# Patient Record
Sex: Male | Born: 1955
Health system: Southern US, Community
[De-identification: ages and names within clinical notes are randomized; demographics above are authoritative.]

## PROBLEM LIST (undated history)

## (undated) DIAGNOSIS — E7800 Pure hypercholesterolemia, unspecified: Secondary | ICD-10-CM

## (undated) DIAGNOSIS — M549 Dorsalgia, unspecified: Secondary | ICD-10-CM

## (undated) DIAGNOSIS — N400 Enlarged prostate without lower urinary tract symptoms: Secondary | ICD-10-CM

## (undated) DIAGNOSIS — I10 Essential (primary) hypertension: Secondary | ICD-10-CM

## (undated) HISTORY — PX: OTHER SURGICAL HISTORY: SHX169

## (undated) HISTORY — PX: LUNG SURGERY: SHX703

---

## 1998-07-25 ENCOUNTER — Encounter: Admission: RE | Admit: 1998-07-25 | Discharge: 1998-10-08 | Payer: Self-pay | Admitting: Anesthesiology

## 1999-10-25 ENCOUNTER — Encounter: Payer: Self-pay | Admitting: Neurosurgery

## 1999-10-25 ENCOUNTER — Ambulatory Visit (HOSPITAL_COMMUNITY): Admission: RE | Admit: 1999-10-25 | Discharge: 1999-10-25 | Payer: Self-pay | Admitting: Neurosurgery

## 1999-11-14 ENCOUNTER — Encounter: Admission: RE | Admit: 1999-11-14 | Discharge: 2000-02-12 | Payer: Self-pay | Admitting: Anesthesiology

## 2000-11-25 ENCOUNTER — Encounter: Payer: Self-pay | Admitting: *Deleted

## 2000-11-25 ENCOUNTER — Ambulatory Visit (HOSPITAL_COMMUNITY): Admission: RE | Admit: 2000-11-25 | Discharge: 2000-11-25 | Payer: Self-pay | Admitting: *Deleted

## 2001-06-15 ENCOUNTER — Inpatient Hospital Stay (HOSPITAL_COMMUNITY): Admission: EM | Admit: 2001-06-15 | Discharge: 2001-06-18 | Payer: Self-pay | Admitting: Emergency Medicine

## 2001-06-16 ENCOUNTER — Encounter: Payer: Self-pay | Admitting: Family Medicine

## 2001-08-20 ENCOUNTER — Emergency Department (HOSPITAL_COMMUNITY): Admission: EM | Admit: 2001-08-20 | Discharge: 2001-08-20 | Payer: Self-pay | Admitting: *Deleted

## 2002-08-01 ENCOUNTER — Ambulatory Visit (HOSPITAL_COMMUNITY): Admission: RE | Admit: 2002-08-01 | Discharge: 2002-08-01 | Payer: Self-pay

## 2002-11-03 ENCOUNTER — Emergency Department (HOSPITAL_COMMUNITY): Admission: EM | Admit: 2002-11-03 | Discharge: 2002-11-03 | Payer: Self-pay | Admitting: Emergency Medicine

## 2002-11-03 ENCOUNTER — Encounter: Payer: Self-pay | Admitting: Emergency Medicine

## 2002-11-06 ENCOUNTER — Emergency Department (HOSPITAL_COMMUNITY): Admission: EM | Admit: 2002-11-06 | Discharge: 2002-11-06 | Payer: Self-pay | Admitting: Emergency Medicine

## 2002-11-22 ENCOUNTER — Encounter: Payer: Self-pay | Admitting: Emergency Medicine

## 2002-11-22 ENCOUNTER — Inpatient Hospital Stay (HOSPITAL_COMMUNITY): Admission: EM | Admit: 2002-11-22 | Discharge: 2002-11-23 | Payer: Self-pay | Admitting: Emergency Medicine

## 2003-11-28 ENCOUNTER — Ambulatory Visit (HOSPITAL_COMMUNITY): Admission: RE | Admit: 2003-11-28 | Discharge: 2003-11-28 | Payer: Self-pay | Admitting: Family Medicine

## 2004-02-25 ENCOUNTER — Encounter: Admission: RE | Admit: 2004-02-25 | Discharge: 2004-02-25 | Payer: Self-pay | Admitting: Sports Medicine

## 2004-10-15 ENCOUNTER — Encounter
Admission: RE | Admit: 2004-10-15 | Discharge: 2005-01-13 | Payer: Self-pay | Admitting: Physical Medicine & Rehabilitation

## 2004-10-20 ENCOUNTER — Ambulatory Visit: Payer: Self-pay | Admitting: Physical Medicine & Rehabilitation

## 2004-12-15 ENCOUNTER — Ambulatory Visit: Payer: Self-pay | Admitting: Physical Medicine & Rehabilitation

## 2005-01-14 ENCOUNTER — Encounter
Admission: RE | Admit: 2005-01-14 | Discharge: 2005-04-14 | Payer: Self-pay | Admitting: Physical Medicine & Rehabilitation

## 2005-03-04 ENCOUNTER — Ambulatory Visit: Payer: Self-pay | Admitting: Physical Medicine & Rehabilitation

## 2005-05-19 ENCOUNTER — Ambulatory Visit (HOSPITAL_COMMUNITY): Admission: RE | Admit: 2005-05-19 | Discharge: 2005-05-19 | Payer: Self-pay | Admitting: Family Medicine

## 2005-05-22 ENCOUNTER — Encounter
Admission: RE | Admit: 2005-05-22 | Discharge: 2005-08-20 | Payer: Self-pay | Admitting: Physical Medicine & Rehabilitation

## 2005-05-22 ENCOUNTER — Ambulatory Visit: Payer: Self-pay | Admitting: Physical Medicine & Rehabilitation

## 2005-06-17 ENCOUNTER — Ambulatory Visit (HOSPITAL_COMMUNITY)
Admission: RE | Admit: 2005-06-17 | Discharge: 2005-06-17 | Payer: Self-pay | Admitting: Physical Medicine & Rehabilitation

## 2005-07-22 ENCOUNTER — Encounter
Admission: RE | Admit: 2005-07-22 | Discharge: 2005-10-20 | Payer: Self-pay | Admitting: Physical Medicine & Rehabilitation

## 2005-07-22 ENCOUNTER — Ambulatory Visit: Payer: Self-pay | Admitting: Physical Medicine & Rehabilitation

## 2005-09-23 ENCOUNTER — Ambulatory Visit: Payer: Self-pay | Admitting: Physical Medicine & Rehabilitation

## 2005-12-09 ENCOUNTER — Encounter
Admission: RE | Admit: 2005-12-09 | Discharge: 2006-03-09 | Payer: Self-pay | Admitting: Physical Medicine & Rehabilitation

## 2005-12-09 ENCOUNTER — Ambulatory Visit: Payer: Self-pay | Admitting: Physical Medicine & Rehabilitation

## 2006-02-24 ENCOUNTER — Ambulatory Visit: Payer: Self-pay | Admitting: Physical Medicine & Rehabilitation

## 2006-04-16 ENCOUNTER — Ambulatory Visit: Payer: Self-pay | Admitting: Physical Medicine & Rehabilitation

## 2006-04-16 ENCOUNTER — Encounter
Admission: RE | Admit: 2006-04-16 | Discharge: 2006-07-15 | Payer: Self-pay | Admitting: Physical Medicine & Rehabilitation

## 2006-05-03 ENCOUNTER — Ambulatory Visit (HOSPITAL_COMMUNITY): Admission: RE | Admit: 2006-05-03 | Discharge: 2006-05-03 | Payer: Self-pay | Admitting: Family Medicine

## 2006-05-19 ENCOUNTER — Ambulatory Visit: Payer: Self-pay | Admitting: Physical Medicine & Rehabilitation

## 2006-07-13 ENCOUNTER — Ambulatory Visit: Payer: Self-pay | Admitting: Physical Medicine & Rehabilitation

## 2006-08-30 ENCOUNTER — Ambulatory Visit (HOSPITAL_COMMUNITY): Admission: RE | Admit: 2006-08-30 | Discharge: 2006-08-30 | Payer: Self-pay | Admitting: Family Medicine

## 2006-09-10 ENCOUNTER — Encounter
Admission: RE | Admit: 2006-09-10 | Discharge: 2006-12-09 | Payer: Self-pay | Admitting: Physical Medicine & Rehabilitation

## 2006-09-13 ENCOUNTER — Ambulatory Visit: Payer: Self-pay | Admitting: Physical Medicine & Rehabilitation

## 2007-02-21 ENCOUNTER — Encounter
Admission: RE | Admit: 2007-02-21 | Discharge: 2007-03-22 | Payer: Self-pay | Admitting: Physical Medicine & Rehabilitation

## 2007-02-21 ENCOUNTER — Ambulatory Visit: Payer: Self-pay | Admitting: Physical Medicine & Rehabilitation

## 2007-04-19 ENCOUNTER — Encounter (INDEPENDENT_AMBULATORY_CARE_PROVIDER_SITE_OTHER): Payer: Self-pay | Admitting: General Surgery

## 2007-04-19 ENCOUNTER — Ambulatory Visit (HOSPITAL_COMMUNITY): Admission: RE | Admit: 2007-04-19 | Discharge: 2007-04-19 | Payer: Self-pay | Admitting: General Surgery

## 2007-06-22 ENCOUNTER — Encounter
Admission: RE | Admit: 2007-06-22 | Discharge: 2007-06-23 | Payer: Self-pay | Admitting: Physical Medicine & Rehabilitation

## 2007-06-22 ENCOUNTER — Ambulatory Visit: Payer: Self-pay | Admitting: Physical Medicine & Rehabilitation

## 2007-09-14 ENCOUNTER — Encounter
Admission: RE | Admit: 2007-09-14 | Discharge: 2007-09-15 | Payer: Self-pay | Admitting: Physical Medicine & Rehabilitation

## 2007-09-15 ENCOUNTER — Ambulatory Visit: Payer: Self-pay | Admitting: Physical Medicine & Rehabilitation

## 2008-01-27 ENCOUNTER — Ambulatory Visit (HOSPITAL_COMMUNITY): Admission: RE | Admit: 2008-01-27 | Discharge: 2008-01-27 | Payer: Self-pay | Admitting: Family Medicine

## 2008-03-02 ENCOUNTER — Encounter
Admission: RE | Admit: 2008-03-02 | Discharge: 2008-03-05 | Payer: Self-pay | Admitting: Physical Medicine & Rehabilitation

## 2008-03-05 ENCOUNTER — Ambulatory Visit: Payer: Self-pay | Admitting: Physical Medicine & Rehabilitation

## 2008-08-03 ENCOUNTER — Encounter
Admission: RE | Admit: 2008-08-03 | Discharge: 2008-08-29 | Payer: Self-pay | Admitting: Physical Medicine & Rehabilitation

## 2008-08-23 ENCOUNTER — Ambulatory Visit: Payer: Self-pay | Admitting: Physical Medicine & Rehabilitation

## 2009-02-20 ENCOUNTER — Encounter
Admission: RE | Admit: 2009-02-20 | Discharge: 2009-02-26 | Payer: Self-pay | Admitting: Physical Medicine & Rehabilitation

## 2009-02-21 ENCOUNTER — Ambulatory Visit: Payer: Self-pay | Admitting: Physical Medicine & Rehabilitation

## 2009-05-23 ENCOUNTER — Ambulatory Visit (HOSPITAL_COMMUNITY): Admission: RE | Admit: 2009-05-23 | Discharge: 2009-05-23 | Payer: Self-pay | Admitting: Family Medicine

## 2009-06-27 ENCOUNTER — Ambulatory Visit (HOSPITAL_COMMUNITY): Admission: RE | Admit: 2009-06-27 | Discharge: 2009-06-27 | Payer: Self-pay | Admitting: Orthopedic Surgery

## 2009-07-18 ENCOUNTER — Ambulatory Visit (HOSPITAL_COMMUNITY): Admission: RE | Admit: 2009-07-18 | Discharge: 2009-07-18 | Payer: Self-pay | Admitting: Family Medicine

## 2009-11-04 ENCOUNTER — Encounter
Admission: RE | Admit: 2009-11-04 | Discharge: 2009-11-07 | Payer: Self-pay | Admitting: Physical Medicine & Rehabilitation

## 2009-11-07 ENCOUNTER — Ambulatory Visit: Payer: Self-pay | Admitting: Physical Medicine & Rehabilitation

## 2010-04-01 ENCOUNTER — Encounter
Admission: RE | Admit: 2010-04-01 | Discharge: 2010-04-07 | Payer: Self-pay | Source: Home / Self Care | Attending: Physical Medicine & Rehabilitation | Admitting: Physical Medicine & Rehabilitation

## 2010-04-07 ENCOUNTER — Ambulatory Visit: Payer: Self-pay | Admitting: Physical Medicine & Rehabilitation

## 2010-07-23 ENCOUNTER — Encounter
Admission: RE | Admit: 2010-07-23 | Discharge: 2010-08-05 | Payer: Self-pay | Source: Home / Self Care | Attending: Physical Medicine & Rehabilitation | Admitting: Physical Medicine & Rehabilitation

## 2010-07-28 ENCOUNTER — Ambulatory Visit
Admission: RE | Admit: 2010-07-28 | Discharge: 2010-07-28 | Payer: Self-pay | Source: Home / Self Care | Attending: Physical Medicine & Rehabilitation | Admitting: Physical Medicine & Rehabilitation

## 2010-08-04 ENCOUNTER — Ambulatory Visit (HOSPITAL_COMMUNITY)
Admission: RE | Admit: 2010-08-04 | Discharge: 2010-08-04 | Payer: Self-pay | Source: Home / Self Care | Attending: Family Medicine | Admitting: Family Medicine

## 2010-11-17 ENCOUNTER — Ambulatory Visit: Payer: Medicare Other | Admitting: Physical Medicine & Rehabilitation

## 2010-11-17 ENCOUNTER — Encounter: Payer: BC Managed Care – PPO | Attending: Physical Medicine & Rehabilitation

## 2010-11-17 ENCOUNTER — Ambulatory Visit: Payer: Self-pay | Admitting: Physical Medicine & Rehabilitation

## 2010-11-17 DIAGNOSIS — M79609 Pain in unspecified limb: Secondary | ICD-10-CM | POA: Insufficient documentation

## 2010-11-17 DIAGNOSIS — M47817 Spondylosis without myelopathy or radiculopathy, lumbosacral region: Secondary | ICD-10-CM | POA: Insufficient documentation

## 2010-11-17 DIAGNOSIS — M545 Low back pain, unspecified: Secondary | ICD-10-CM | POA: Insufficient documentation

## 2010-11-17 NOTE — Procedures (Signed)
[name], Victor Fields               ACCOUNT NO.:  [id number]  [name]           PATIENT TYPE:  O  [name]                          [name]  PHYSICIAN:  [name], M.D.DATE OF BIRTH:  [birth year]  DATE OF PROCEDURE:  11/17/2010 DATE OF DISCHARGE:                              OPERATIVE REPORT  This is bilateral L5 dorsal ramus injection, bilateral L4, and bilateral L3 medial branch block under fluoroscopic guidance.  INDICATIONS:  Lumbar spondylosis, left greater than right side with lumbar pain radiating to the thighs.  The pain is only partially responsive to medication management.  Did not respond to SI injections. Pain is rated as 6/10, interfering with walking, bending, sitting. Oswestry score 66%.  Informed consent was obtained after describing risks and benefits of the procedure with the patient.  These include bleeding, bruising, and infection.  He elects to proceed and has given written consent.  The patient was placed prone on fluoroscopy table.  Betadine prep, sterile drape.  A 25-gauge 1-1/2-inch needle was used to anesthetize the skin and subcutaneous tissue, 1% lidocaine x2 mL, and a 22-gauge 3-1/2-inch spinal needle was inserted under fluoroscopic guidance, starting at the left S1 SAP sacral ala junction, bone contact made, confirmed with lateral imaging.  Omnipaque 180 x 0.5 mL demonstrated no intravascular uptake.  Then, 0.5 mL solution containing 1 mL of 4 mg/mL dexamethasone, 2 mL of 2% MPF lidocaine.  Then, the left L5 SAP transverse process junction targeted, bone contact made.  Omnipaque 180 x 0.5 mL demonstrated no intravascular uptake, then 0.5 mL of dexamethasone lidocaine solution was injected.  Then, the left L4 SAP transverse process junction targeted, bone contact made.  Omnipaque 180 x 0.5 mL demonstrated no intravascular uptake.  Then, 0.5 mL of the dexamethasone lidocaine solution was injected.  The same  procedure was repeated on the right side using same needle, injectate, and technique.  The patient tolerated the procedure well.  Pre and post injection vitals stable. Post injection instructions given.     [name], M.D. Electronically Signed    AEK/MEDQ  D:  11/17/2010 09:50:53  T:  11/17/2010 22:07:20  Job:  [id number]

## 2010-11-18 NOTE — Procedures (Signed)
NAMEASLAN, Victor NO.:  [id number]   [name]          PATIENT TYPE:  REC   [name]                          [name]   PHYSICIAN:  [name], M.D.DATE OF BIRTH:  [birth year]   DATE OF PROCEDURE:  06/23/2007  DATE OF DISCHARGE:                               OPERATIVE REPORT   Left sacroiliac injection under fluoroscopic guidance.   INDICATIONS:  Left sacroiliac pain.  He has had previous good relief  with sacroiliac injection.  Pain in his low back has been relieved after  lumbar radiofrequency procedure.  Pain in his buttocks had flared up  over the last month or so.  His pain persists despite narcotic analgesic  medications.   Informed consent was obtained after describing risks and benefits of the  procedure to the patient.  These include bleeding, bruising, infection.  He elects to proceed and has given written consent.   The patient placed prone on fluoroscopy table.  Betadine prep, sterile  drape.  A 25-gauge inch and half needle was used to anesthetize the skin  and subcu tissue with 1% lidocaine x2 mL, and a 25-gauge 3-inch spinal  needle was inserted into left SI joint under AP lateral imaging.  Omnipaque 20 x 0.5 mL demonstrated good joint outline, followed by  injection of 1 mL of 2% MPF lidocaine and 0.5 mL of 40 mg/mL of Depo-  Medrol.  The patient tolerated the procedure well.  Pre/post-injection  vitals stable.      [name], M.D.  Electronically Signed     AEK/MEDQ  D:  06/23/2007 12:38:41  T:  06/24/2007 09:48:20  Job:  [id number]   cc:   Patrica Duel, M.D.  Fax: [phone]

## 2010-11-18 NOTE — H&P (Signed)
NAMECOLLYN, Victor Fields NO.:  [id number]   [name]          PATIENT TYPE:  AMB   LOCATION:  DAY                           FACILITY:  [facility]   PHYSICIAN:  Dalia Heading, M.D.  DATE OF BIRTH:  05-05-1956   DATE OF ADMISSION:  04/19/2007  DATE OF DISCHARGE:  LH                              HISTORY & PHYSICAL   CHIEF COMPLAINT:  Need for screening colonoscopy.   HISTORY OF PRESENT ILLNESS:  The patient is a 55 year old white male who  is referred for endoscopic. Evaluation.  He needs a colonoscopy for  screening purposes.  No abdominal pain, weight loss, nausea, vomiting,  diarrhea, constipation, melena, hematochezia ever noted.  He has never  had a colonoscopy.  There is no family history of colon carcinoma.   PAST MEDICAL HISTORY:  1. Hypertension.  2. Depression.  3. Chronic pain.   PAST SURGICAL HISTORY:  Left leg, hip, knee surgeries.   CURRENT MEDICATIONS:  1. Simvastatin.  2. Xanax.  3. Blood pressure pill.  4. Muscle relaxant.  5. Lexapro.  6. OxyContin.   ALLERGIES:  No known drug allergies.   REVIEW OF SYSTEMS:  The patient smokes a pack of cigarettes a day.  Denies any alcohol use.  He denies any cardiopulmonary difficulties or  bleeding disorders.   PHYSICAL EXAMINATION:  GENERAL:  The patient is a well-developed, well-  nourished white male in no acute distress.  LUNGS:  Clear to auscultation with equal breath sounds bilaterally.  HEART:  Reveals regular rate and rhythm without S3, S4, murmurs.  ABDOMEN:  Soft, nontender, nondistended.  No hepatosplenomegaly or  masses are noted.  RECTAL:  Was deferred to the procedure.   IMPRESSION:  Need for screening colonoscopy.   PLAN:  The patient is scheduled for a colonoscopy on 04/19/07.  The  risks and benefits of the procedure including bleeding and perforation  were fully explained to the patient who gave informed consent.      Dalia Heading, M.D.  Electronically Signed     MAJ/MEDQ  D:  03/29/2007  T:  03/29/2007  Job:  [id number]   cc:   Dalia Heading, M.D.  Fax: [phone]   Patrica Duel, M.D.  Fax: [phone]

## 2010-11-18 NOTE — Procedures (Signed)
NAMEDOMENIC, SCHOENBERGER NO.:  [id number]   [name]          PATIENT TYPE:  REC   [name]                          [name]   PHYSICIAN:  [name], M.D.DATE OF BIRTH:  1956-04-23   DATE OF PROCEDURE:  09/15/2007  DATE OF DISCHARGE:                               OPERATIVE REPORT   PROCEDURE PERFORMED:  Left sacroiliac injection under fluoroscopic  guidance.   INDICATIONS:  Left sided lumbar, buttocks, and posterior thigh pain. He  has had only partially relief with medication management and has had  good relief in the past with sacroiliac injections with the duration  effect approximately three months.  Informed consent was obtained after  describing the risks and benefits of the procedure to the patient.  These include bleeding, bruising, infection, loss of bowel and bladder  function, temporary or permanent paralysis.  He elects to proceed and  has given written consent.  He denies any new medications.  He denies  any antibiotics or blood thinners.   PROCEDURE IN DETAIL:  The patient was placed prone on the fluoroscopy  table.  Betadine prep, sterile drape.  A 25 gauge 1 1/2 inch needle was  used to anesthetize the skin and subcu tissue, 1% lidocaine x2 mL.  Then, a 22 gauge 3 1/2 inch spinal needle was inserted under  fluoroscopic guidance in the left SI joint AP, lateral, and oblique  imaging.  Omnipaque 180 showed good joint arthrogram followed by  injection of 1 mL of 2% MPF lidocaine 0.5 mL of 40 mg/mL Depo-Medrol.  Pre-injection pain levels 7-9 out of 10.  Post injection pain level 2  out of 10.  Post injection instructions given.  Return in three months  for repeat injection as this has been his usual pattern.      [name], M.D.  Electronically Signed     AEK/MEDQ  D:  09/15/2007 09:05:56  T:  09/15/2007 23:50:03  Job:  [id number]

## 2010-11-18 NOTE — Procedures (Signed)
[name], Fields NO.:  [id number]   [name]          PATIENT TYPE:  REC   [name]                          [name]   PHYSICIAN:  [name], M.D.DATE OF BIRTH:  03/28/1956   DATE OF PROCEDURE:  DATE OF DISCHARGE:                               OPERATIVE REPORT   This is a left sacroiliac injection.   INDICATIONS:  Left sacroiliac disorder.  He has had a 6 months duration  of relief with the previous injection, last injection done in February  2010.  Pain is only partially responsive to medication management  including narcotic analgesics and interferes with self-care and  mobility.   Informed consent was obtained after describing risks and benefits of the  procedure with the patient.  These include bleeding, bruising,  infection, temporary, or permanent lower extremity weakness.  The  patient elects to proceed and has given written consent.  The patient  was placed prone on fluoroscopy table.  Betadine prep, sterile drape.  A  25-gauge 1-1/2-inch needle was used to anesthetize the skin and subcu  tissue with 1% lidocaine x2 mL.  Then, a 25-gauge 3-inch spinal needle  was inserted into left SI joint under AP and lateral imaging.  Omnipaque  180 under live fluoro demonstrated good spread of contrast followed by  injection of 0.5 mL of 40 mg/mL Depo-Medrol and 1 mL of 2% MPF  lidocaine.  The patient tolerated the procedure well.  Pre -and post-  injection vitals stable.  Post-injection instructions given.  He will  call me when the injections off again.   ADDENDUM:  The patient has had right knee pain.  He feels like he has  been favoring the left side based on his left SI pain.  We will see how  he does with this.  I could certainly inject his right knee if needed at  another time.      [name], M.D.  Electronically Signed     AEK/MEDQ  D:  02/21/2009 09:36:53  T:  02/21/2009 14:39:35   Job:  [id number]

## 2010-11-18 NOTE — Procedures (Signed)
Victor, Fields NO.:  [id number]   [name]          PATIENT TYPE:  REC   [name]                          [name]   PHYSICIAN:  [name], M.D.DATE OF BIRTH:  [birth year]   DATE OF PROCEDURE:  03/05/2008  DATE OF DISCHARGE:                               OPERATIVE REPORT   PROCEDURE:  This is sacroiliac injection under fluoroscopic guidance.   Informed consent was obtained after describing risks and benefits of the  procedure with the patient.  These include bleeding, bruising,  infection.  He elects to proceed and has given written consent.   INDICATIONS:  The left groin and right-sided lower back pain, which  interferes with bathing, meal prep, household duties, and ambulation.  Only partially responsive to medication management.  Previous relief  with sacroiliac injection last performed on September 12, 2007.   The patient placed prone on fluoroscopy table.  Betadine prep, sterile  drape 25-gauge, 1-1/2-inch needle was used to anesthetize the skin and  subcutaneous tissues with 1% lidocaine x3 mL, and a 25-gauge 3-inch  spinal needle was inserted under fluoroscopic guidance targeting the  left SI joint.  AP, lateral and oblique imaging utilized.  Omnipaque 180  on the left floor demonstrated no intravascular uptake and then solution  containing 0.5 mL of 40 mg/mL Depo-Medrol and 1 mL of 2% MPF lidocaine  injected.  The patient tolerated procedure well.  Pre and postinjection  vitals stable.  Post injection instructions were given.  Pre injection  pain level 7/10.  Post injection 0/10.      [name], M.D.  Electronically Signed     AEK/MEDQ  D:  03/05/2008 11:51:34  T:  03/06/2008 03:12:53  Job:  [id number]   cc:   Patrica Duel, M.D.  Fax: [phone]

## 2010-11-18 NOTE — Assessment & Plan Note (Signed)
REASON FOR VISIT:  Followup today from a lumbar radiofrequency  procedure, L5 dorsal ramus, L4 medial branch, L3 medial branch under  fluoroscopic guidance.   SUBJECTIVE:  He has had good relief of his left-sided low back pain. His  prior RF was on 12/10/05.   He had no post-injection complications. The patient's  pain level 3/10  currently. Sleep is fair. Pain improves with medications, TENS. Relief  from meds is fair to good. He can walk 10 minutes at a time. He climbs  steps. He drives. He needs some assistance with meal prep, household  duties and shopping.   REVIEW OF SYSTEMS:  Positive for numbness, tremor, tingling left lower,  confusion, depression, anxiety but negative for suicidal thoughts.   PHYSICAL EXAMINATION:  VITAL SIGNS: His blood pressure is 107/60, pulse  78, respiratory rate is 18, 02 sat 95% on room air.  GENERAL APPEARANCE: No acute distress. Mood and affect appropriate.  BACK: No tenderness to palpation. He has good forward flexion and  extension. Lower-extremity strength is normal. Deep tendon reflexes are  normal. He does have some tightness and limitation of range of motion in  hips.   IMPRESSION:  1. Lumbar facet syndrome with improvement after radiofrequency      procedure approximately 1 month ago. I will schedule him back on a      p.r.n. basis. Does not need a sacroiliac radiofrequency procedure      at this time. If he has a flareup of his sacroiliac symptoms, I      asked him to call in and schedule a sacroiliac injection. Once      again, went over expected duration of radiofrequency procedure for      low back pain on the left side and he had about 1 year of relief      with previous, so I would expect similar duration this time, but it      may range anywhere from 6 to 18 months.      [name], M.D.  Electronically Signed     AEK/MedQ  D:  03/22/2007 10:38:55  T:  03/22/2007 12:44:13  Job #:  [id number]   cc:   Patrica Duel,  M.D.  Fax: [phone]

## 2010-11-18 NOTE — Procedures (Signed)
NAMETHELTON, GRACA NO.:  [id number]   [name]          PATIENT TYPE:  REC   [name]                          [name]   PHYSICIAN:  [name], M.D.DATE OF BIRTH:  [birth year]   DATE OF PROCEDURE:  02/21/2007  DATE OF DISCHARGE:                               OPERATIVE REPORT   PROCEDURE:  Left lumbar radio frequency.   The left lobe last lumbar RF was performed 12/10/2005.  He did have  sacroiliac RF 10/21/2006.  He has had some recurrence of back pain and  given that he had good relief with the lumbar RF in past, elected to  repeat procedure.   PROCEDURE:  Informed consent obtained describing risks and benefits  procedure to the patient include bleeding, bruising, infection, elects  to proceed and has given written consent.  The patient placed prone on  fluoroscopy table.  Betadine prep, sterile drape 25-gauge inch and half  needle was used to anesthetize skin and subcu tissues.   PROCEDURE:  Left L5 dorsal ramus injection and our radiofrequency  neurotomy, left L4 medial branch block and radiofrequency neurotomy,  left L3 medial branch block radiofrequency neurotomy.   INDICATIONS:  Lumbar pain only partial response to medication management  has had good relief from prior RF procedure.   The patient placed prone on fluoroscopy table.  Betadine prep, sterile  drape.  25 gauge 1-1/2 inch needle was used to anesthetize skin and  subcu tissue 1% lidocaine x2 ccs 20 gauge 10-cm RF needle with 10 mm  curved active tip was inserted under fluoroscopic guidance targeting  left S1 SAP sacral ala junction, bone contact made confirmed with  lateral imaging.  Motor stim x3 volts demonstrated no lower extremity  twitch followed by injection of 1 mL of solution containing 1 mL of 40  mg/mL dexamethasone and 2 mL of 2% lidocaine.  Then RF lesioning 70  degrees x 70 seconds formed.  Next the left L5 SAP transverse  junction  targeted bone contact made confirmed with lateral imaging.  Motor stim  x3 volts demonstrated no lower extremity twitch followed by injection 1  mL of dexamethasone lidocaine solution and RF lesioning 70 degrees 70  seconds and L3 SAP transverse junction targeted bone contact made  confirmed lateral imaging, motor stim x3 volts demonstrated no lower  extremity twitch followed by RF lesioning 70 degrees 70 seconds. The  patient tolerated procedure well, pain went from 8/10 to 0/10.  See him  back in 1 month will see if pain is higher or lower.  If moved  higher  may need to do additional RF levels at L2 and L3 and if it moves lower,  would need to do sacroiliac RF.   Post injection instructions given.      [name], M.D.  Electronically Signed    AEK/MEDQ  D:  02/21/2007 17:07:13  T:  02/22/2007 10:16:09  Job:  [id number]

## 2010-11-18 NOTE — Procedures (Signed)
Victor Fields, Victor Fields NO.:  [id number]   [name]           PATIENT TYPE:   LOCATION:                                 FACILITY:   PHYSICIAN:  [name], M.D.DATE OF BIRTH:  [birth year]   DATE OF PROCEDURE:  DATE OF DISCHARGE:                               OPERATIVE REPORT   This is a left sacroiliac injection.   INDICATIONS:  Left sacroiliac pain.  He has had approximately 5-6 months  duration of effect from previous injections.  Last injection was done on  August 2009.   Denies any anticoagulant use or any antibiotic use.  No new interval  medical history.   INDICATIONS:  Back pain that is only partially relieved by medications  and interfere with self-care mobility.   Informed consent was obtained after describing risks and benefits of  procedure with the patient.  These include bleeding, bruising,  infection, temporary or permanent lower extremity weakness.  The patient  elects to proceed.  The patient was placed prone on the fluoroscopy  table.  Betadine prep, sterile drape.  A 25-gauge 1-1/2-inch needle was  used to anesthetize the skin and subcu tissue with 1% lidocaine x2 mL,  then a 25-gauge 3-inch spinal needle was inserted in the left SI joint  under AP and lateral imaging.  Omnipaque 180 under live fluoro  demonstrated good.  Good spread of contrast material by injection of 1.5  mL of 40 mg/mL Depo-Medrol and 1 mL of 2% MPF lidocaine.  The patient  tolerated the procedure well.  Pre- and post-injection vitals were  stable.  Post-injection instructions were given.  The patient is to call  when injection worse off again.      [name], M.D.  Electronically Signed     AEK/MEDQ  D:  08/23/2008 13:39:20  T:  08/24/2008 02:58:40  Job:  [id number]

## 2010-11-21 NOTE — Procedures (Signed)
NAMEKALIEL, BOLDS NO.:  [id number]   [name]          PATIENT TYPE:  REC   [name]                          [name]   PHYSICIAN:  [name], M.D.DATE OF BIRTH:  1956-03-30   DATE OF PROCEDURE:  12/15/2004  DATE OF DISCHARGE:                                 OPERATIVE REPORT   PROCEDURE:  Right S1 transforaminal lumbar epidural steroid injection under  fluoroscopic guidance.   INDICATIONS:  Right lower extremity pain to the foot.  Previous injection,  right S1, had complete pain relief for two weeks and then gradually tapering  off to where he is at the preinjection level today.  Current pain level 5-  6/10.  Last injection was done Nov 03, 2004.   Informed consent was obtained after describing the risks and benefits of the  procedure with the patient.  These include bleeding, bruising, infection,  loss of bowel or bladder function, temporary or permanent paralysis.  He  elects to proceed.   The patient placed prone on the fluoroscopy table, Betadine prep and sterile  drape.  A 25-gauge inch and a half needle was used to anesthetize the skin  and subcu tissue, 1% lidocaine x3 mL.  Then a 22-gauge 3-1/2 inch spinal  needle with stylet was inserted under fluoroscopic guidance into the right  S1 foramen.  Both AP and lateral images were utilized.  Once proper needle  location was ascertained, Omnipaque 180 under live fluoroscopic examination  using IV extension tubing demonstrated no intravascular uptake and  appropriate foraminal flow.  Then a solution containing 1 mL of 40 mg/mL  Depo-Medrol plus 2 mL of methylparaben-free lidocaine were injected.  The  patient tolerated the procedure well.  Post-injection instructions were  given.  The patient will return in three to four weeks for repeat injection  in the same location.       AEK/MEDQ  D:  12/15/2004 14:27:07  T:  12/15/2004 14:51:00  Job:  [id number]
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
## 2010-11-21 NOTE — Procedures (Signed)
[facility]  Patient:    Victor Fields, Victor Fields                      MRN: [id number] Proc. Date: 12/08/99 Adm. Date:  [id number] Attending:  Thyra Breed CC:         Reinaldo Meeker, M.D.                           Procedure Report  PROCEDURE:  Lumbar epidural steroid injection.  DIAGNOSIS:  Degenerative disk disease of the lumbar spine.  INTERVAL HISTORY:  The patient noted improvement after his last injection and presents for his third today.  PHYSICAL EXAMINATION:  VITAL SIGNS:  Blood pressure 130/76, heart rate 76, respiratory rate 16, O2 saturation 97%.  Pain level is 6 out of 10, and temperature is 98.1.  MUSCULOSKELETAL/NEUROLOGIC:  The patient demonstrated good healing from his previous injection site.  Neurologic exam is grossly unchanged.  DESCRIPTION OF PROCEDURE:  After informed consent was obtained, the patient was placed in the sitting position and monitored.  His back was prepped with Betadine x 3.  A skin wheal was raised at the L4-5 interspace with 1% lidocaine.  A 20 gauge Tuohy needle was introduced in the lumbar epidural space and loss of resistance to preservative-free normal saline.  There was no CSF nor blood.  Medrol 80 mg in 10 ml of preservative-free normal saline was gently injected.  The needle was flushed with preservative-free normal saline and removed intact.  POSTPROCEDURE CONDITION:  Stable.  DISCHARGE INSTRUCTIONS: 1. Resume previous diet. 2. Limitation on activities per instruction sheet. 3. Continue on current medications. 4. Patient plans to follow up with Dr. Gerlene Fee. DD:  12/08/99 TD:  12/10/99 Job: [id number]

## 2010-11-21 NOTE — Procedures (Signed)
[name], Victor NO.:  [id number]   [name]          PATIENT TYPE:  REC   [name]                          [name]   PHYSICIAN:  [name], M.D.DATE OF BIRTH:  09/15/1955   DATE OF PROCEDURE:  05/25/2005  DATE OF DISCHARGE:                                 OPERATIVE REPORT   MEDICAL RECORD NUMBER:  [id number].   PROCEDURE:  Left sacroiliac joint injection under fluoroscopic guidance.   INDICATIONS:  Left buttock pain, positive Faber's, sacroiliac pain, no  radicular pain, improved after last injection on March 05, 2005 but now  recurring. Not a candidate for narcotic analgesic management secondary to  prior history.   Informed consent was obtained after describing risks and benefits of the  procedure to the patient. These include bleeding, bruising, infection, loss  of bowel and bladder function, temporary or permanent paralysis, and he  elects to proceed and has given written consent. The patient placed prone on  fluoroscopy table, Betadine prep, sterile drape. A 25-gauge 1-1/2-inch  needle was used to anesthetize skin and subcu tissues. Because bone was  encountered with this, the same needle was then maneuvered into the left SI  joint under fluoroscopic guidance, confirmed under lateral imaging, and then  injection of Omnipaque 180 showed good outline of sacroiliac joint, and a  solution containing 0.5 cc of 40 mg/cc of methylprednisolone plus 1 cc of 2%  methylparaben-free lidocaine was injected. The patient tolerated the  procedure well. Pre-injection pain level is 4/10. I will see him back in one  month for repeat right SI transforaminal epidural injection for right lower  extremity radicular pain.      [name], M.D.  Electronically Signed     AEK/MEDQ  D:  05/25/2005 10:21:18  T:  05/25/2005 10:38:08  Job:  [id number]

## 2010-11-21 NOTE — Discharge Summary (Signed)
[facility]  Patient:    COLVIN, BLATT Visit Number: [id number] MRN: [id number]          Service Type: MED Location: 3A A321 01 Attending Physician:  Colette Ribas Dictated by:   Colette Ribas, M.D. Admit Date:  06/15/2001 Discharge Date: 06/18/2001                             Discharge Summary  DISCHARGE DIAGNOSES:  Prostatitis/pyelonephritis.  HISTORY OF PRESENT ILLNESS/PAST MEDICAL HISTORY:  Please see admission H&P.  HOSPITAL COURSE:  A 55 year old healthy gentleman who was admitted with pyelonephritis and question of urosepsis.  There was also a question of prostatitis.  He was admitted for observation with IV fluids and IV antibiotics.  Levaquin 500 mg IV q.24h. was begun.  Rocephin and azithromycin were both given in the emergency department.  Urine and blood cultures were both obtained.  On the day after admission patient felt remarkably better.  Tmax 100.9.  Renal ultrasound was done and reported negative per Dr. Regino Schultze.  On June 18, 2001 the patient was ready for discharge.  Had remained afebrile.  Felt quite well.  PHYSICAL EXAMINATION:  Please see progress note as Dr. Regino Schultze discharged the patient.  DISCHARGE MEDICATIONS: 1. Cipro 750 b.i.d. x7 days. 2. Lorcet 10 p.r.n. pain. 3. Xanax 1 mg p.r.n. anxiety.  FOLLOWUP:  [place] in one week.  CONDITION ON DISCHARGE:  Greatly improved. Dictated by:   Colette Ribas, M.D. Attending Physician:  Colette Ribas DD:  07/14/01 TD:  07/14/01 Job: [id number]

## 2010-11-21 NOTE — Assessment & Plan Note (Signed)
MEDICAL RECORD NUMBER:  [id number].   REASON FOR VISIT:  Increased pain status post left sacroiliac joint  injection under fluoroscopic guidance.   The patient gives history of pain starting about the day after his injection  which was done April 24, 2005. He has previous SI injection August 31 that  helped.   The patient wishes to discuss other treatments options. MRI was ordered and  performed on June 18, 2005 and really showed no abnormal changes in the  SI joint and mild degenerative changes in the hips. No sacral fracture.   The patient's pain area is over the SI area and buttocks area at L5 and  inferior, mainly centered on the left side. Does have some left gluteal area  pain.   PHYSICAL EXAMINATION:  Examination showed some gluteal pain. Deep tendon  reflexes normal. Strength is normal. Faber's showed some pain mainly in the  hip area. He is able to bend forward about 75% normal extension about 75%  normal range.   IMPRESSION:  Left hip and buttock pain. This may be more referable to  diskogenic versus some sciatic type pain rather than sacroiliac pain. Will  inject left S1 foramen, and if this is not particularly successful, then the  last type of injection therapy that may benefit would be left sided L4-5/L5-  S1 facet injections, specifically medial branch blocks. Will do the left S1  today, assess efficacy, and proceed on from there. Discussed with patient,  and he understands.      [name], M.D.  Electronically Signed     AEK/MedQ  D:  06/22/2005 10:25:29  T:  06/23/2005 11:58:49  Job #:  [id number]   cc:   Patrica Duel, M.D.  Fax: [phone]

## 2010-11-21 NOTE — Procedures (Signed)
[facility]  Patient:    Victor Fields, Victor Fields                      MRN: [id number] Proc. Date: 11/21/99 Adm. Date:  [id number] Attending:  Thyra Breed CC:         Reinaldo Meeker, M.D.                           Procedure Report  PROCEDURE PERFORMED:  Lumbar epidural steroid injection.  ANESTHESIOLOGIST:  Thyra Breed, M.D.  DIAGNOSIS:  Degenerative disk disease of the lumbar spine.  INTERVAL HISTORY:  The patient has noted a mild to modest improvement after his first injection.  He is here for his second one.  PHYSICAL EXAMINATION:  Blood pressure 125/72, heart rate 74, respiratory rate 12, oxygen saturation 97%, pain level is 4/10.  Temperature is 97.2.  His back shows good healing from the previous injection site.  Neuro was grossly intact.  DESCRIPTION OF PROCEDURE:  After informed consent was obtained, the patient was placed in sitting position and monitored.  His back was prepped with Betadine x 3.  A skin wheal was raised at the L4-5 interspace with 1% lidocaine. A 20 gauge Tuohy needle was introduced into the lumbar epidural space to a loss of resistance to preservative free normal saline.  There was no CSF or blood.  120 mg of Medrol in 10 ml of preservative free normal saline were gently injected.  The needle was flushed with preservative free normal saline and removed intact.  Postprocedure condition was stable.  DISCHARGE INSTRUCTIONS: 1. Resume previous diet. 2. Limitations in activities per instruction sheet. 3. Continue on current medications. 4. The patient plans to follow up with Dr. Gerlene Fee. DD:  11/21/99 TD:  11/26/99 Job: [id number]

## 2010-11-21 NOTE — Procedures (Signed)
NAMEKARAN, RAMNAUTH NO.:  [id number]   [name]          PATIENT TYPE:  REC   [name]                          [name]   PHYSICIAN:  [name], M.D.DATE OF BIRTH:  06/01/1956   DATE OF PROCEDURE:  01/15/2005  DATE OF DISCHARGE:                                 OPERATIVE REPORT   MEDICAL RECORD NUMBER:  [id number].   PROCEDURE:  Right transforaminal epidural steroid injection under  fluoroscopic guidance.   INDICATIONS:  Back pain, right lower extremity pain down to the foot,  previous good relief with injections on December 15, 2004 and Nov 03, 2004, with  changing out a pump when he hurt his back again yesterday.   Informed consent was obtained after describing risks and benefits of the  procedure to the patient. These include bleeding, bruising, infection, loss  of bowel and bladder function, temporary or permanent paralysis, and he  elects to proceed. The patient placed prone on fluoroscopy table, Betadine  prep, sterile drape. A 25-gauge 1-1/2-inch needle was used to anesthetize  skin and subcu tissues with 1% lidocaine x3 cc. A 25-gauge 3-1/2-inch spinal  needle was inserted under fluoroscopic guidance into the right SI foramen.  Both AP and lateral images were utilized. Once proper needle location was  ascertained, Omnipaque 180 under live fluoroscopic injection using IV  extension tubing demonstrated no intravascular uptake and appropriate  foraminal flow. Then a solution containing 1 cc of 40 mg/cc of Depo-Medrol  plus 2 cc of methylparaben-free lidocaine were injected. The patient  tolerated the procedure well. Post-injection instructions given. Pre-  injection pain level 8, post-injection pain level 1.       AEK/MEDQ  D:  01/15/2005 13:55:42  T:  01/16/2005 08:04:59  Job:  [id number]   cc:   Patrica Duel, M.D.  [address]  [place]  [place] [address]  Fax: [phone]

## 2010-11-21 NOTE — Procedures (Signed)
NAMECASON, LUFFMAN NO.:  [id number]   [name]           PATIENT TYPE:   LOCATION:                                 FACILITY:   PHYSICIAN:  [name], M.D.DATE OF BIRTH:  1955-07-12   DATE OF PROCEDURE:  12/10/2005  DATE OF DISCHARGE:                                 OPERATIVE REPORT   PROCEDURE:  Left-sided lumbar radiofrequency.   OPERATOR:  [name], M.D.   INDICATIONS FOR PROCEDURE:  For 50% improvement of pain in the left-sided  low back pain with two sets of left-sided medial branch blocks in the lumbar  spine.   INFORMED CONSENT:  An informed consent was obtained after describing the  risks and benefits of the procedure.  These include bleeding, infection,  oozing, loss of bowel and bladder function, temporary or permanent  paralysis.  The patient wishes to proceed and has given a written consent.  The pain has failed other conservative measures.   DESCRIPTION OF PROCEDURE:  A #25 gauge, 1.5 inch needle was used to  anesthetize the skin and subcutaneous tissue, with 2 mL at each of the three  sites.  Then the #22 gauge, 10 cm RF needle curved tip with 10 mm active was  inserted under fluoroscopic guidance, first starting at the left S1/SAP  sacroiliac junction.  Bone contact was made and reconfirmed with lateral  imaging.  Motor stem at 3-4 demonstrated no lower extremity twitch by  injection of solution containing 1 mL of 40 mg per mL of Kenalog plus 2 mL  of 1% methylparaben-free lidocaine, 1 mL injected.  Then the left L5/SAP  trans-process junction targeted.  Bone contact was made and reconfirmed with  lateral imaging.  The lower stem at 3 volts demonstrated no lower extremity  twitch.  This was followed by injection of 1 mL of the Depo-Medrol/lidocaine  solution, followed by RF lesioning at 70 degrees x70 seconds.  Lastly the  left L4/SAP trans-process junction targeted.  Bone contact was made and  reconfirmed with lateral imaging.  Motor stem at 3 volts demonstrated no  lower extremity twitch, followed by an injection of 1 mL of the Depo-  Medrol/lidocaine solution, followed by RF lesioning at 70 degrees x70  seconds.  Pre-injection pain level was 5/10.  Post-injection pain level  0/10.   The patient tolerated the procedure well.      [name], M.D.  Electronically Signed     AEK/MEDQ  D:  12/10/2005 17:16:04  T:  12/11/2005 12:25:06  Job:  [id number]
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
## 2010-11-21 NOTE — Procedures (Signed)
NAMEKENDLE, Victor Fields NO.:  [id number]   [name]          PATIENT TYPE:  REC   [name]                          [name]   PHYSICIAN:  [name], M.D.DATE OF BIRTH:  [birth year]   DATE OF PROCEDURE:  08/20/2005  DATE OF DISCHARGE:                                 OPERATIVE REPORT   PROCEDURE:  Left-sided L3 and L2 medial branch block.   INDICATIONS:  Back pain, axial left-sided.  Good response to left-sided L3-4  medial branch blocks and L5 dorsal ramus injection.  Now the pain seems to  be a little further up on the left side.   Informed consent was obtained after describing risks and benefits of the  procedure to the patient.  These included bleeding, bruising, infection,  loss of bowel and bladder function, temporary or permanent paralysis.  He  has elected to proceed and has given written consent.   Patient placed prone on fluoroscopy table, Betadine prep, sterile drape.  A  25-gauge 1-1/2 inch needle used to anesthetize skin and subcu tissues, 2 mL  x2 sites.  Then a 22-gauge 3-1/2 inch spinal needle inserted, first in the  left L4 SAP-transverse process junction, bone contact made and confirmed  with lateral imaging.  Omnipaque 180 x0.5 mL demonstrated no intravascular  uptake.  Then 0.5 mL of a solution containing 0.5 mL of 40 mg/mL Depo-Medrol  and 1 mL of 2% methylparaben-free lidocaine were injected.  Then the left L3  SAP-transverse junction targeted, bone contact made, confirmed with lateral  imaging.  Omnipaque 180 under live fluoroscopy demonstrated no intravascular  uptake, and 0.5 mL of Depo-Medrol-lidocaine solution was injected.  The  patient tolerated the procedure well.  Postinjection instructions given.  He  is to return in four to six weeks.  He may need a left S1 transforaminal  epidural steroid injection versus medial branch blocks, multilevel on the  left, and then set up for  radiofrequency.      [name], M.D.  Electronically Signed     AEK/MEDQ  D:  08/20/2005 16:38:41  T:  08/21/2005 07:12:18  Job:  [id number]

## 2010-11-21 NOTE — Procedures (Signed)
NAMELOREN, Victor               ACCOUNT NO.:  [id number]   [name]          PATIENT TYPE:  REC   [name]                          [name]   PHYSICIAN:  [name], M.D.DATE OF BIRTH:  [birth year]   DATE OF PROCEDURE:  10/21/2006  DATE OF DISCHARGE:                               OPERATIVE REPORT   PROCEDURES:  L4 medial branch block and radiofrequency, L5 dorsal ramus  injection and radiofrequency, and left sacroiliac joint injection under  fluoroscopic guidance.   INDICATION:  Left sacroiliac joint pain previously relieved temporarily  with sacroiliac injection.  The patient is not a candidate for narcotic  analgesics due to a history of illicit drug use.   Informed consent was obtained after describing the risks and benefits of  the procedure to the patient.  These include bleeding, bruising,  infection, infection, loss of bowel or bladder function, temporary or  permanent paralysis, and he elects to proceed and has given written  consent.  The patient placed prone on fluoroscopy table.  Betadine prep,  sterile drape.  A 25-gauge inch and half needle was used to incise skin  and subcu tissue, 1% lidocaine divided into eight areas with 1.5 mL  each.  First a 25-gauge 2-1/2-inch needle was inserted under  fluoroscopic guidance into the left SI joint SAP, lateral imaging  utilized, then a solution containing 0.5 mL 4 mg/mL dexamethasone plus 1  mL of 1% lidocaine was injected.  Then a 25-gauge 5-cm RF needle with a  5 mm straight active tip was inserted first at the lateral border of S3,  AP, lateral images utilized.  Injection of 0.5 mL of the dexamethasone-  lidocaine solution, followed by RF lesioning, 70 degrees x70 seconds.  Then the area just lateral to S2 neural foramen posteriorly was  targeted, AP, lateral imaging utilized, injection of 0.5 mL of  dexamethasone-lidocaine solution, followed by RF lesioning, 70 degrees  x70  seconds.  Then the S1 lateral border targeted, bone contact made,  followed by injection of dexamethasone-lidocaine solution and RF  lesioning, 70 degrees x70 seconds.  Then three areas on the medial  border of the sacroiliac joint had RF lesioning performed, 7 degrees x70  seconds, after injection of 0.5 mL of dexamethasone-lidocaine solution.  Next a 10-cm 20-gauge RF needle with a 10-mm curved active tip was  inserted starting at the left L5 sacral ala-S1 junction, bone contact  made, confirmed with lateral imaging.  Motor Stim 3 V demonstrated no  lower extremity twitch, followed by injection of the dexamethasone-  lidocaine solution and RF lesioning, 70 degrees x70 seconds, and finally  the left L5 SAP-transverse process junction targeted, bone contact made,  confirmed with lateral imaging.  Motor Stim x3 V demonstrated no lower  extremity twitch, followed by injection of 0.5 c of the dexamethasone-  lidocaine solution and RF lesioning, 70 degrees x70 seconds.  The  patient tolerated the procedure well.  Pre and post injection vitals  stable.  Follow up in 1 month to see how he does with the procedure.  [name], M.D.  Electronically Signed     AEK/MEDQ  D:  10/21/2006 11:05:22  T:  10/21/2006 15:41:42  Job:  [id number]

## 2010-11-21 NOTE — Procedures (Signed)
NAMEPARAG, DORTON NO.:  [id number]   [name]          PATIENT TYPE:  REC   [name]                          [name]   PHYSICIAN:  [name], M.D.DATE OF BIRTH:  06-24-1956   DATE OF PROCEDURE:  06/22/2005  DATE OF DISCHARGE:                                 OPERATIVE REPORT   MEDICAL RECORD NUMBER:  [id number].   PROCEDURE:  Left S1 transforaminal epidural steroid injection under  fluoroscopic guidance.   Informed consent was obtained after describing risks and benefits of the  procedure to the patient and his girlfriend. Both understand possible risk  for bleeding, bruising, infection, loss of bowel and bladder function,  paralysis. He elects to proceed. The patient placed prone on fluoroscopy  table, Betadine prep, sterile drape. A 25-gauge 1-1/2-inch needle was used  to anesthetize skin and subcu tissues. A 22-gauge 3-1/2-inch spinal needle  was inserted in the right S1 foramen under both AP and lateral imaging.  Omnipaque 180 under live fluoroscopy demonstrated no intravascular uptake.  Then a solution containing 1 cc of 40 mg/cc of Depo-Medrol and 2 cc of 1%  methylparaben-free lidocaine was injected. The patient tolerated the  procedure well. Pre-injection pain 6/10, post-injection 0/10. Return in one  month for possible reinjection.      [name], M.D.  Electronically Signed     AEK/MEDQ  D:  06/22/2005 10:21:55  T:  06/23/2005 11:47:14  Job:  [id number]   cc:   Patrica Duel, M.D.  Fax: [phone]

## 2010-11-21 NOTE — Procedures (Signed)
[name], Fields NO.:  [id number]   [name]          PATIENT TYPE:  REC   [name]                          [name]   PHYSICIAN:  [name], M.D.DATE OF BIRTH:  1955/08/08   DATE OF PROCEDURE:  03/05/2005  DATE OF DISCHARGE:                                 OPERATIVE REPORT   MEDICAL RECORD NUMBER:  [id number].   This is a left sacroiliac joint injection under fluoroscopic guidance.   INDICATIONS:  Left buttock pain with positive Faber's maneuver. No radicular  pain down into the foot. Pain increases with ambulation.   The patient is not a good candidate for narcotic analgesic management  secondary to positive history for illicit drugs.   Pain level is 10/10.   Informed consent was obtained after describing risks and benefits of the  procedure to the patient. These include bleeding, bruising, infection, loss  of bowel and bladder function, temporary or permanent paralysis, and he  elects to proceed and has given written consent. The patient placed prone on  fluoroscopy table, Betadine prep, sterile drape. A 25-gauge 1-1/2-inch  needle was used to anesthetize skin and subcu tissues with 1% lidocaine x2  cc. Then, a 25-gauge 3-inch needle was inserted into the left SI joint under  fluoroscopic guidance, confirmed under lateral imaging. Injection of  Omnipaque under live fluoro demonstrated good outline of the left sacroiliac  joint, and then a solution containing 0.5 cc of 40 mg/cc of  methylprednisolone plus 1 cc of 2% methylparaben-free lidocaine were  injected. The patient tolerated the procedure well. Preinjection pain level  10/10. Postinjection pain level 0/10. The patient tolerated the procedure  well. Return in three months for possible reinjection.           ______________________________  [name], M.D.     AEK/MEDQ  D:  03/05/2005 15:41:31  T:  03/05/2005 16:26:03  Job:  [id number]
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
## 2010-11-21 NOTE — Op Note (Signed)
[facility]  Patient:    Victor Fields, Victor Fields                      MRN: [id number] Proc. Date: 11/14/99 Adm. Date:  [id number] Attending:  Thyra Breed                           Operative Report  PROCEDURE:  Lumbar epidural steroid injection.  DIAGNOSIS:  Lumbar degenerative disk disease.  INTERVAL HISTORY:  Ashwin Tibbs is a very pleasant gentleman, who is sent for a repeat series of lumbar epidural steroid injections.  The patient was last seen back in February 2000 and received epidural injections.  He did well up until September 2000, when he had recurrence of his pain.  His pain has progressively increased in severity, and is predominately involving his mid lumbar region.  he is taking hydrocodone, which helped significantly to reduce his pain, and Vioxx as well as Xanax.  He is not allergic to any medications.  EXAMINATION:  VITAL SIGNS:  Blood pressure 120/60, heart rate 65, respiratory rate 16, O2 saturations 97%, pain level 4 out of 10, temperature 97.1.  NEUROLOGIC:  Straight-leg signs are negative. DD:  11/14/99 TD:  11/18/99 Job: [id number]

## 2010-11-21 NOTE — Assessment & Plan Note (Signed)
Patient originally scheduled for left sacroiliac radiofrequency  procedure following improvement of the sacroiliac joint pain from a 9  out of 10 to a 1 out of 10.  This is done on 05/20/06, 07/13/06 and went  from a 10 to a 3.  However in the interval period he has been treated  first of all for a bronchitis with steroid and antibiotics.  He has  finished this however feels like he is having increasing difficulty with  urination.  He has been told he has had a swollen prostate in the past  and in fact was hospitalized with severe prostatitis a couple years ago.  He does have an appointment with urology this week.  He has had no  fevers.  He is has not taken any blood thinners, anti-inflammatories or  antibiotics currently.  His blood pressure is 101/68, pulse 83,  respirations 16, O2 sat 95% on room air.  Generally in no acute  distress.  Gait is with favoring of the left lower extremity.  Mood and  affect appropriate.   Given history of prostatitis and his current difficulties would like to  defer the injection until after his urologic evaluation which will be  later on this week.  If he has no evidence of infection per urology, I  would proceed on with the injections and procedure.  If he does he would  heed to complete an antibiotic course prior to scheduling.      [name], M.D.  Electronically Signed     AEK/MedQ  D:  09/13/2006 09:38:49  T:  09/13/2006 10:45:58  Job #:  [id number]

## 2010-11-21 NOTE — Procedures (Signed)
NAMEPARAM, CAPRI               ACCOUNT NO.:  [id number]   [name]          PATIENT TYPE:  REC   [name]                          [name]   PHYSICIAN:  [name], M.D.DATE OF BIRTH:  [birth year]   DATE OF PROCEDURE:  09/24/2005  DATE OF DISCHARGE:                                 OPERATIVE REPORT   PROCEDURE:  Left L5 dorsal ramus injection, L4 medial branch block, L3  medial branch block, and L2 medial branch block under fluoroscopic guidance.   INDICATION:  Facet-mediated pain with prior improvements after left L2-L3  medial branch block, February 15th, as well as a left L3-L4 medial branch,  and L5 dorsal ramus injection, July 23, 2005.   Informed consent was obtained after describing the risks and benefits of the  procedure with the patient.  These include bleeding, bruising, infection,  loss of bowel or bladder function, temporary or permanent paralysis, she  elects to proceed and has given her written consent.  The patient placed  prone on the fluoroscopy table, Betadine prep, and sterile draped.  A 25-  gauge, inch-and-a-half needle was used to anesthetize the skin and subcu  tissues with 1% Lidocaine x1.5 ml.  Then, a 22-gauge, three-and-a-half inch  spinal needle was utilized for the procedure first targeting the left S1 SAP  sacroiliac junction, bone contact made and confirmed with lateral imaging,  0.5 ml of Omnipaque-180 under live fluoro demonstrated no intravascular  uptake, then 0.5 ml of a solution containing 0.5 ml of 40 mg/ml Depo-Medrol  plus 2 ml of 1% methylparaben-free Lidocaine.  Next, the L5 SAP transverse  process junction targeted, bone contact made and confirmed with lateral  imaging.  Omnipaque-180 x0.5 ml demonstrated no intravascular uptake, then  0.5 ml of the Depo-Medrol/Lidocaine solution was injected.  Next, the left  L4 SAP transverse junction targeted, bone contact made and confirmed with  lateral imaging.  Omnipaque-180 x0.5 ml demonstrated no intravascular  uptake, and 0.5 ml of the Depo-Medrol/Lidocaine solution were injected, and  last the left L3 SAP transverse process junction targeted, bone contact mad  and confirmed with lateral imaging.  Omnipaque-180 x05 ml under live fluoro  demonstrated no intravascular uptake, then 0.5 ml of the Depo-  Medrol/Lidocaine solution were injected.  The patient tolerated the  procedure well.  Pre-injection pain level 7 out of 10, post injection pain  level 5 out of 10.      [name], M.D.  Electronically Signed     AEK/MEDQ  D:  09/24/2005 17:10:58  T:  09/26/2005 14:29:49  Job:  [id number]

## 2010-11-21 NOTE — H&P (Signed)
NAME:  Victor Fields, Victor Fields                         ACCOUNT NO.:  [id number]   [name]                   PATIENT TYPE:  INP   LOCATION:  A209                                 FACILITY:  [facility]   PHYSICIAN:  Sarita Bottom, M.D.                  DATE OF BIRTH:  [birth year]   DATE OF ADMISSION:  11/22/2002  DATE OF DISCHARGE:                                HISTORY & PHYSICAL   PRIMARY CARE PHYSICIAN:  Unassigned.   CHIEF COMPLAINT:  I passed out.   HISTORY OF PRESENT ILLNESS:  The patient is a 55 year old man with a history  of hypertension, chronic back and leg pain.  He was apparently well until  this afternoon when he passed out while he was talking to a friend.  The  friend denies any seizure-like activity and said the patient passed out for  about a minute.  He had another episode of syncope immediately he was  revived.  Patient denies any palpitations or chest pain prior to the event.  He admits to having some dizziness just prior to the event.  Of note is that  the patient was recently put on metoprolol and was noted to have a low heart  rate and he was asked to reduce his metoprolol dose.  The patient was noted  to have a heart rate in the 50s while he was being evaluated in the  emergency room.   REVIEW OF SYSTEMS:  He denies any fever or malaise.  RESPIRATORY:  He denies  any cough or shortness of breath.  CVS:  Denies any chest pain or  palpitations.  GI:  Denies any nausea or vomiting or diarrhea.  MUSCULOSKELETAL:  Admits to chronic back pain and leg pain.  All other  systems reviewed and they were negative.   PAST MEDICAL HISTORY:  1. He has a history of hypertension.  2. Depression.  3. Chronic back pain.  He has been told that he has a herniated disk.   CURRENT MEDICATIONS:  1. Lexapro 10 mg.  2. Metoprolol 50 mg.  3. Viagra 100 mg.  4. Tramadol.  5. Hydrochlorothiazide.  6. Flexeril.  7. Also takes Darvocet-N.   ALLERGIES:  He has no known  drug allergies.   FAMILY HISTORY:  Significant  for heart disease in his father.  His father  had an MI in his 30s.  His mother also suffered from cancer and diabetes  mellitus.   SOCIAL HISTORY:  He is single.  He lives with his girlfriend.  He drinks  alcohol occasionally.  He smokes cigarettes and uses marijuana every now and  then.  He is currently unemployed.   PHYSICAL EXAMINATION:  VITAL SIGNS:  His blood pressure is 117/76 lying, on  standing his BP is 126/98, heart rate is 68 on lying and 76 on standing.  GENERAL:  He is a young man, not in  any form of distress.  Lying comfortably  in bed.  HEENT:  He is not pale.  He is anicteric.  Moist oral mucosa.  Pupils are  equal and reactive to light and accomodation.  NECK:  Supple.  CHEST:  Clear to auscultation.  CVS:  Heart sounds S1 and S2 are normal.  Rhythm appears regular.  No  murmurs were heard.  ABDOMEN:  Benign.  CNS:  He is alert and oriented  x 3.  No gross or focal neurological  deficits.  EXTREMITIES:  He has no edema.   DIAGNOSTIC/LABORATORY STUDIES:  His EKG shows a normal sinus rhythm at 57  beats per minute.  He has no acute ST or T wave changes.   Head CT done in the emergency room was negative for any acute mass or bleed.   Urine drug screen is positive for benzodiazepines, opiates and  tetrahydrocannabinols.  His alcohol level is 10.  Troponin is 0.04, CK is  147, MB fraction is 1.7.  His liver function test is normal.  Sodium is 136,  potassium is 3.6, chloride is 100, CO2 is 25, BUN is 14, creatinine is 1.4,  glucose is 115.  Calcium is 9.9.  WBCs 9.7, hemoglobin is 15, MCV is 90.5,  platelet count is 270.   ASSESSMENT/PLAN:  1. Syncope.  Query symptomatic bradycardia.  Patient will be admitted to     telemetry to rule out acute myocardial infarction and arrhythmia.  Serial     cardiac enzymes and EKG will be ordered.  Patient will be placed on     telemetry monitoring.  His metoprolol will be held at  this time because     of the bradycardia.   1. Hypertension.  Patient's hypertension will be treated with     hydrochlorothiazide 25 mg daily.  His blood pressure will be monitored on     this regimen and medication adjusted accordingly.   1. Chronic back pain.  Patient will be given Vicodin 1-2 tablets q.6h.     p.r.n.   1. Depression and anxiety.  This will be treated with Xanax and ______ .   1. Substance abuse and smoking.  Patient has been counseled on smoking     cessation and cessation of marijuana use.   1. Patient to be admitted under the hospitalist's service.  Further     management will depend on clinical course.                                               Sarita Bottom, M.D.    DW/MEDQ  D:  11/22/2002  T:  11/22/2002  Job:  [id number]

## 2010-11-21 NOTE — Procedures (Signed)
Victor Fields, Victor Fields NO.:  [id number]   [name]           PATIENT TYPE:   LOCATION:                                 FACILITY:   PHYSICIAN:  [name], M.D.DATE OF BIRTH:  [birth year]   DATE OF PROCEDURE:  DATE OF DISCHARGE:                               OPERATIVE REPORT   PROCEDURE PERFORMED:  Left sacroiliac joint injection under fluoroscopic  guidance.   INDICATIONS FOR PROCEDURE:  Left sacroiliac pain previously relieved by  left sacroiliac injection May 20, 2006.   Pain is only partially responsive to oral medications.   Informed consent was obtained after describing the risks and benefits of  the procedure with patient. These include bleeding, bruising, infection,  loss of bowel or bladder function, temporary or permanent paralysis and  he elects to proceed and has given written consent.   Patient placed prone on fluoroscopy table.  Betadine prep and sterile  drape.  25 gauge 1-1/2 inch needle was used to anesthetize skin and  subcutaneous tissue with 1% lidocaine x2 mL.  Then a 25 gauge 2-1/2-inch  spinal needle was inserted in the left SI under fluoroscopic guidance,  AP lateral imaging.  Omnipaque 180 x 0.5 mL demonstrated no  intravascular uptake. Then a solution containing 0.75 mL of 40 mg per mL  Depo Medrol and 0.75 mL of 2% lidocaine were injected.  The patient  tolerated the procedure well.  Post injection instructions given.   He is to return in approximately two months for radiofrequency of the  sacroiliac.      [name], M.D.  Electronically Signed     AEK/MEDQ  D:  07/13/2006 08:57:52  T:  07/13/2006 10:25:05  Job:  [id number]

## 2010-11-21 NOTE — Procedures (Signed)
NAMELORRY, ANASTASI NO.:  [id number]   [name]          PATIENT TYPE:  REC   [name]                          [name]   PHYSICIAN:  [name], M.D.DATE OF BIRTH:  01-16-1956   DATE OF PROCEDURE:  05/20/2006  DATE OF DISCHARGE:                               OPERATIVE REPORT   PROCEDURE PERFORMED:  Sacroiliac joint injection on the left side.   INDICATIONS FOR PROCEDURE:  Pain only partially responsive to oral  medications with previous good relief of pain in that area following a  sacroiliac injection.   DESCRIPTION OF PROCEDURE:  Informed consent obtained after describing  risks and benefits of the procedure to the patient, these include  bleeding, bruising,  loss of bowel or bladder function, temporary or  permanent paralysis and he elects to proceed and has given written  consent.   Patient placed prone on fluoroscopy table.  Betadine prep and sterile  drapes.  A 25 gauge 1-1/2-inch needle was used to anesthetize skin and  subcutaneous tissue with 1% lidocaine x2 mL.  Then a 25 gauge 3-inch  spinal needle was inserted under fluoroscopic guidance into the left SI  joint.  AP and lateral oblique imaging utilized.  Omnipaque 180 x 0.5 mL  demonstrated no intravascular uptake.  Then a solution containing 1 mL  of 2% NPF lidocaine and 0.5 mL of 40 mg per mL Depo-Medrol injected.  The patient tolerated the procedure well.  Pre and post injection vitals  stable.  Pre injection pain level 9/10, post injection pain level 1/10.  Post injection instructions given.  Return in two months.      [name], M.D.  Electronically Signed     AEK/MEDQ  D:  07/13/2006 08:59:26  T:  07/13/2006 10:42:18  Job:  [id number]

## 2010-11-21 NOTE — Procedures (Signed)
[facility]  Patient:    Victor Fields, Victor Fields                      MRN: [id number] Proc. Date: 11/14/99 Adm. Date:  [id number] Attending:  Thyra Breed CC:         Reinaldo Meeker, M.D.                           Procedure Report  PROCEDURE PERFORMED:  Lumbar epidural steroid injection.  ANESTHESIOLOGIST:  Thyra Breed, M.D.  DIAGNOSIS:  Lumbar degenerative disk disease.  INTERVAL HISTORY:  Victor Fields is a very pleasant gentleman who is sent for a  repeat series of lumbar epidural steroid injections.  The patient was last seen  back in February of 2000 and received epidural steroid injections and did well p until September when he had recurrence of his pain.  His pain has progressively  increased in severity and is involving predominantly his midlumbar region.  He s taking hydrocodone which helped significantly to reduce his pain, and Vioxx as ell as Xanax.  He is not allergic to any medications.  PHYSICAL EXAMINATION:  Blood pressure 120/60, heart rate 65, respiratory rate 16, oxygen saturation 97%, pain level is 4/10.  Temperature is 97.1.  Straight leg raising signs are negative.  Deep tendon reflexes are symmetric in lower extremities.  Motor is 5/5.  Sensory exam is intact to pinprick and vibratory sense.  DESCRIPTION OF PROCEDURE:  After informed consent was obtained, the patient was  placed in sitting position and monitored.  His back was prepped with Betadine x 3. A skin wheal was raised at the L4-5 interspace with 1% lidocaine. A 20 gauge Tuohy needle was introduced into the lumbar epidural space to a loss of resistance to  preservative free normal saline.  There was no CSF or blood.  120 mg of Medrol n 10 ml of preservative free normal saline were gently injected.  The needle was flushed with preservative free normal saline and removed intact.  Postprocedure condition was stable.  DISCHARGE INSTRUCTIONS: 1. Resume previous  diet. 2. Limitations in activities per instruction sheet. 3. Continue on current medications. 4. Follow up with me in one week for second in a series of lumbar epidural steroid    injections. DD:  11/14/99 TD:  11/18/99 Job: [id number]

## 2010-11-21 NOTE — Procedures (Signed)
NAMENICOLO, TOMKO NO.:  [id number]   [name]          PATIENT TYPE:  REC   [name]                          [name]   PHYSICIAN:  [name], M.D.DATE OF BIRTH:  1955/11/26   DATE OF PROCEDURE:  11/03/2004  DATE OF DISCHARGE:                                 OPERATIVE REPORT   MEDICAL RECORD NUMBER:  [id number].   DATE OF BIRTH:  [birth year]   PROCEDURE:  Right S1 transforaminal epidural steroid injection under  fluoroscopic guidance.   INDICATIONS FOR PROCEDURE:  Right lower extremity as well as low back pain.  The lower extremity pain goes down to the right foot. Pain interferes with  household duties, shopping. Poor sleep and only fair relief from  medications.   Informed consent was obtained after describing the risks and benefits of the  procedure to the patient. These include bleeding, bruising, infection, loss  of bowel and bladder function, temporary or permanent paralysis, he elects  to proceed and has given written consent.   He denies any allergies to any medications injecting today including  Omnipaque, lidocaine and Kenalog.   The patient placed prone on the fluoroscopy table, Betadine prep, sterile  drape, 25 gauge 1.5inch needle was used after anesthetizing the skin and  subcu tissues with 1 mL of 1% lidocaine. Then a 22 gauge 3 1/2 inch spinal  needle was inserted under fluoroscopic guidance into the right S1 foramen.  AP and lateral images were utilized demonstrating preop placement and then  Omnipaque 180 demonstrated good epidural flow through the foramen and  spreading axially.   Then a solution including 140 mL of 40 mg/mL of Kenalog plus 2 mL of 1%  lidocaine MPF were injected. The patient tolerated the procedure well, post  injection instructions given, return in three weeks. Possible reinjection.      AEK/MEDQ  D:  11/03/2004 11:39:09  T:  11/03/2004 12:04:27  Job:  [id number]

## 2010-11-21 NOTE — Procedures (Signed)
NAMEZEBULIN, Victor Fields               ACCOUNT NO.:  [id number]   [name]          PATIENT TYPE:  REC   [name]                          [name]   PHYSICIAN:  [name], M.D.DATE OF BIRTH:  [birth year]   DATE OF PROCEDURE:  07/23/2005  DATE OF DISCHARGE:                                 OPERATIVE REPORT   PROCEDURE:  Left-sided L4 and L3 medial branch block and L5 dorsal ramus  injection.   INDICATIONS:  Back pain, which is more axial.  He had good response to left  S1 transforaminal epidural steroid injection in terms of his radicular pain.   He is not a narcotic candidate secondary to illicit drug use.  He uses  Neurontin and Flexeril.   Informed consent was obtained after describing the risks and benefits of the  procedure with the patient.  These include bleeding, bruising, infection,  loss of bowel and bladder function, temporary or permanent paralysis.  He  elected to proceed and has given written consent.   The patient was placed prone on the fluoroscopy table, Betadine prep,  sterile drape.  A 25-gauge 1-1/2 inch needle was used to anesthetize the  subcu tissues, 2 mL x3 sites.  Then a 22-gauge 3-1/2 inch spinal needle was  inserted, first at the left S1-sacral ala junction, bone contact made,  confirmed with lateral imaging.  Omnipaque 180 x0.5 mL demonstrated no  intravascular uptake.  Then 0.5 mL of a solution containing 0.5 mL of 40  mg/mL Depo-Medrol and 1.5 mL of 2% methylparaben-free lidocaine .  Then the  left L5 SAP-transverse process junction targeted, bone contact made,  confirmed with lateral imaging.  Omnipaque 180 under live fluoroscopy  demonstrated no intravascular uptake.  Then 0.5 mL of the Depo-Medrol-  lidocaine solution were injected and then the left L4 SAP-transverse process  junction was targeted, bone contact made, confirmed with lateral imaging,  and 0.5 mL of the Depo-Medrol-lidocaine solution were  injected.  The patient  tolerated the procedure well, post-injection instructions given.  Patient to  return for repeat injection in three to four weeks.      [name], M.D.  Electronically Signed     AEK/MEDQ  D:  07/23/2005 16:43:48  T:  07/24/2005 08:56:07  Job:  [id number]

## 2010-11-21 NOTE — Procedures (Signed)
Victor Fields, [name]   [name]          PATIENT TYPE:  REC   [name]                          [name]   PHYSICIAN:  [name], M.D.DATE OF BIRTH:  September 30, 1955   DATE OF PROCEDURE:  02/25/2006  DATE OF DISCHARGE:                                 OPERATIVE REPORT   PROCEDURE PERFORMED:  Left sacroiliac joint injection under fluoroscopic  guidance.  Preinjection pain level 8/10.  Patient has had good improvement after lumbar  radio frequency on the left side but has pain lower down now.  He has had  previous good result with sacroiliac injections in the past approximately  one year ago.  Denies any recent fevers or anticoagulant usage.   Informed consent was obtained after describing risks and benefits of the  procedure to the patient.  These include bleeding, bruising, infection, loss  of bowel and bladder function, temporary or permanent paralysis and he  elects to proceed and has given written consent.   Patient placed prone on fluoroscopy table.  Betadine prep and sterile drape.  A 25 gauge 1-1/2 inch needle was used to anesthetize skin and subcutaneous  tissue with 1% lidocaine x 2 mL.  Then 22 gauge 3-1/2 inch spinal needle was  inserted in the left S1 joint.  AP and lateral imaging utilized.  Omnipaque  180 under live fluoroscopy demonstrated no intravascular uptake.  0.5 mL of  a solution containing 0.5 mL of 40 mg per mL Depo-Medrol and 1 mL of 2%  methylparaben free lidocaine were injected.  The patient tolerated the  procedure well.  Pre injection pain level 8/10.  Postinjection pain level  0/10.  He will call when pain starts recurring.  He will get his meds  through Patrica Duel, M.D.      [name], M.D.  Electronically Signed     AEK/MEDQ  D:  02/25/2006 16:57:56  T:  02/26/2006 08:58:02  Job:  [id number]

## 2010-11-21 NOTE — Procedures (Signed)
Victor Fields, DIPIERO NO.:  [id number]   [name]          PATIENT TYPE:  REC   [name]                          [name]   PHYSICIAN:  [name], M.D.DATE OF BIRTH:  02-Jun-1956   DATE OF PROCEDURE:  04/19/2006  DATE OF DISCHARGE:                                 OPERATIVE REPORT   PROCEDURE:  Right S1 transforaminal epidural steroid injection under  fluoroscopic guidance.   INDICATIONS:  Right lower extremity radicular pain.  He has had similar pain  in the past relieved by right S1 epidural steroid injection done under  fluoroscopic guidance, last done in August 2006.  His pain is keeping him up  at night.  He is not a good candidate for narcotic analgesic medications  given history of cocaine abuse.  Informed consent was obtained after  describing the risks and benefits of the procedure to the patient including  bleeding, bruising, infection, loss of bowel and bladder function, temporary  or permanent paralysis, he elects to proceed and has given written consent.   DESCRIPTION OF PROCEDURE:  The patient was placed prone on the fluoroscopy  table, Betadine prep and sterile draped.  A 25 gauge 1.5 inch needle was  used to anesthetize the skin and subcu tissue with 1% lidocaine x2 mL.  Then, a 22 gauge 3.5 inch spinal needle was inserted into the right S1  sacral foramen.  AP and lateral imaging utilized.  Omnipaque 180 under live  fluoroscopy demonstrated no intravascular uptake.  A solution containing 1  mL of 1% lidocaine and 2 mL of 4 mg per mL Decadron were injected.  Pre-  injection pain level 8 out of 10, post injection pain level 0 out of 10.  The patient tolerated the procedure well and will follow up in one month for  possible repeat injection after which we will have to wait a few months  before another injection can be given due to the number of injections he has  had this year.      [name], M.D.  Electronically Signed     AEK/MEDQ  D:  04/19/2006 16:22:05  T:  04/20/2006 19:51:19  Job:  [id number]

## 2010-11-21 NOTE — Discharge Summary (Signed)
   NAME:  Victor, Fields NO.:  [id number]   [name]                   PATIENT TYPE:  INP   LOCATION:  A209                                 FACILITY:  [facility]   PHYSICIAN:  Sarita Bottom, M.D.                  DATE OF BIRTH:  08-16-1955   DATE OF ADMISSION:  11/22/2002  DATE OF DISCHARGE:                                 DISCHARGE SUMMARY   HISTORY OF PRESENT ILLNESS:  Victor Fields is a 55 year old man with a  history of hypertension and chronic back pain.  He was admitted yesterday to [facility] after he presented to the  emergency room with a complaint of syncopal episode.  The patient denies any  chest pain or palpitations prior to that incident.  He apparently was on a  beta blocker for hypertension and had been even told to cut down his dosage  because of a low heart rate.  In the emergency room the patient was noted to  have a heart rate of 53. His physical examination was otherwise  unremarkable.  Diagnostic studies on admission included a head CT which was  negative for any acute bleed, masses, or infarct.  Cardiac enzymes were  essentially normal.  Admission EKG was essentially normal except for a heart  rate in the 50s.  The patient was admitted and monitored on telemetry and  his clinical course was uneventful.  He had been seen in today he has no new  complaints. He denies any chest pains or palpitations.   PHYSICAL EXAMINATION:  Physical examination is essentially normal.  VITAL SIGNS:  BP is 167/84, heart rate of 60.   LABORATORY DATA:  His latest labs shows a troponin of 0.01, CP of 109, MB of  1.2.  Sodium 136, potassium 3.7, BUN 16, creatinine 1.4, glucose 115.   PLAN:  The patient will be discharged home today to follow up with [facility].   DISCHARGE CONDITION:  Stable and satisfactory.                                               Sarita Bottom, M.D.    DW/MEDQ  D:   11/23/2002  T:  11/23/2002  Job:  [id number]   cc:   Dr. Eduard Roux

## 2011-02-05 ENCOUNTER — Ambulatory Visit: Payer: Medicare Other | Admitting: Physical Medicine & Rehabilitation

## 2011-02-05 ENCOUNTER — Encounter: Payer: BC Managed Care – PPO | Attending: Physical Medicine & Rehabilitation

## 2011-02-05 DIAGNOSIS — M545 Low back pain, unspecified: Secondary | ICD-10-CM | POA: Insufficient documentation

## 2011-02-05 DIAGNOSIS — M79609 Pain in unspecified limb: Secondary | ICD-10-CM | POA: Insufficient documentation

## 2011-02-05 DIAGNOSIS — M47817 Spondylosis without myelopathy or radiculopathy, lumbosacral region: Secondary | ICD-10-CM | POA: Insufficient documentation

## 2011-02-09 ENCOUNTER — Ambulatory Visit: Payer: Medicare Other | Admitting: Physical Medicine & Rehabilitation

## 2011-03-02 ENCOUNTER — Ambulatory Visit: Payer: BC Managed Care – PPO | Admitting: Physical Medicine & Rehabilitation

## 2011-03-02 DIAGNOSIS — M47817 Spondylosis without myelopathy or radiculopathy, lumbosacral region: Secondary | ICD-10-CM

## 2011-03-02 NOTE — Procedures (Signed)
Victor Fields, Victor Fields               ACCOUNT NO.:  [id number]  [name]           PATIENT TYPE:  O  [name]                          [name]  PHYSICIAN:  [name], M.D.DATE OF BIRTH:  [birth year]  DATE OF PROCEDURE:  03/02/2011 DATE OF DISCHARGE:  11/17/2010                              OPERATIVE REPORT  PROCEDURE:  Bilateral L5 dorsal ramus injection, bilateral L4 and L3 medial branch blocks under fluoroscopic guidance.  INDICATION:  Lumbar pain.  Pain is only partially responsive to medication management and other conservative care.  He has had very good results lasting normal 3 months after last injection  performed on Nov 17, 2010.  Pain has recurred and is noted at a 6-7/10 level. Interfering with walking, bending, sitting, standing.  Informed consent was obtained after describing risks and benefits of the procedure with the patient.  These include bleeding, bruising, and infection.  He elects to proceed and has given written consent.  The patient was placed prone on fluoroscopy table.  Betadine prep, sterile drape, 25-gauge inch and half needle was used to anesthetize the skin and subcutaneous tissue with 1% lidocaine x2 mL.  Then, a 22-gauge and 3- 1/2-inch spinal needle was inserted under fluoroscopic guidance first starting the left S1 SAP sacral ala junction, bone contact made. Omnipaque 180 x 0.5 mL demonstrated no intravascular uptake.  Then, 0.5 mL of solution containing 1 mL of 4 mg/mL dexamethasone and 2% immediate MPF lidocaine was injected.  Then, left L5 SAP transverse process junction targeted, bone contact made.  Omnipaque 180 x 0.5 mL demonstrated no intravascular uptake.  Then, 0.5 mL dexamethasone- lidocaine solution was injected.  Then, left L4 SAP transverse process junction targeted, bone contact made.  Omnipaque 180 x 0.5 mL demonstrated no intravascular uptake.  Then, 0.5 mL of the dexamethasone- lidocaine  solution was injected.  The same procedure was repeated on the right side using same needle injectate and technique.  The patient tolerated the procedure well.  Pre- and post-injection vitals stable. Post injection instructions were given.     [name], M.D. Electronically Signed    AEK/MEDQ  D:  03/02/2011 13:34:28  T:  03/02/2011 15:45:52  Job:  [id number]

## 2011-06-08 ENCOUNTER — Encounter: Payer: Medicare Other | Admitting: Physical Medicine & Rehabilitation

## 2011-08-03 DIAGNOSIS — L219 Seborrheic dermatitis, unspecified: Secondary | ICD-10-CM | POA: Diagnosis not present

## 2011-08-03 DIAGNOSIS — J449 Chronic obstructive pulmonary disease, unspecified: Secondary | ICD-10-CM | POA: Diagnosis not present

## 2011-08-03 DIAGNOSIS — F411 Generalized anxiety disorder: Secondary | ICD-10-CM | POA: Diagnosis not present

## 2011-08-03 DIAGNOSIS — G8929 Other chronic pain: Secondary | ICD-10-CM | POA: Diagnosis not present

## 2011-08-03 DIAGNOSIS — Z6829 Body mass index (BMI) 29.0-29.9, adult: Secondary | ICD-10-CM | POA: Diagnosis not present

## 2011-09-03 DIAGNOSIS — J449 Chronic obstructive pulmonary disease, unspecified: Secondary | ICD-10-CM | POA: Diagnosis not present

## 2011-09-03 DIAGNOSIS — M159 Polyosteoarthritis, unspecified: Secondary | ICD-10-CM | POA: Diagnosis not present

## 2011-09-03 DIAGNOSIS — G8929 Other chronic pain: Secondary | ICD-10-CM | POA: Diagnosis not present

## 2011-09-25 DIAGNOSIS — J449 Chronic obstructive pulmonary disease, unspecified: Secondary | ICD-10-CM | POA: Diagnosis not present

## 2011-09-25 DIAGNOSIS — G8929 Other chronic pain: Secondary | ICD-10-CM | POA: Diagnosis not present

## 2011-09-25 DIAGNOSIS — E785 Hyperlipidemia, unspecified: Secondary | ICD-10-CM | POA: Diagnosis not present

## 2011-10-30 DIAGNOSIS — J449 Chronic obstructive pulmonary disease, unspecified: Secondary | ICD-10-CM | POA: Diagnosis not present

## 2011-10-30 DIAGNOSIS — E785 Hyperlipidemia, unspecified: Secondary | ICD-10-CM | POA: Diagnosis not present

## 2011-10-30 DIAGNOSIS — M159 Polyosteoarthritis, unspecified: Secondary | ICD-10-CM | POA: Diagnosis not present

## 2011-10-30 DIAGNOSIS — G8929 Other chronic pain: Secondary | ICD-10-CM | POA: Diagnosis not present

## 2011-10-30 DIAGNOSIS — Z6830 Body mass index (BMI) 30.0-30.9, adult: Secondary | ICD-10-CM | POA: Diagnosis not present

## 2011-11-27 DIAGNOSIS — J449 Chronic obstructive pulmonary disease, unspecified: Secondary | ICD-10-CM | POA: Diagnosis not present

## 2011-11-27 DIAGNOSIS — I10 Essential (primary) hypertension: Secondary | ICD-10-CM | POA: Diagnosis not present

## 2011-11-27 DIAGNOSIS — G8929 Other chronic pain: Secondary | ICD-10-CM | POA: Diagnosis not present

## 2011-11-27 DIAGNOSIS — M159 Polyosteoarthritis, unspecified: Secondary | ICD-10-CM | POA: Diagnosis not present

## 2011-12-14 DIAGNOSIS — Z79899 Other long term (current) drug therapy: Secondary | ICD-10-CM | POA: Diagnosis not present

## 2011-12-14 DIAGNOSIS — Z0000 Encounter for general adult medical examination without abnormal findings: Secondary | ICD-10-CM | POA: Diagnosis not present

## 2011-12-25 DIAGNOSIS — I10 Essential (primary) hypertension: Secondary | ICD-10-CM | POA: Diagnosis not present

## 2011-12-25 DIAGNOSIS — G8929 Other chronic pain: Secondary | ICD-10-CM | POA: Diagnosis not present

## 2011-12-25 DIAGNOSIS — J449 Chronic obstructive pulmonary disease, unspecified: Secondary | ICD-10-CM | POA: Diagnosis not present

## 2011-12-25 DIAGNOSIS — Z6829 Body mass index (BMI) 29.0-29.9, adult: Secondary | ICD-10-CM | POA: Diagnosis not present

## 2012-01-29 DIAGNOSIS — E785 Hyperlipidemia, unspecified: Secondary | ICD-10-CM | POA: Diagnosis not present

## 2012-01-29 DIAGNOSIS — I10 Essential (primary) hypertension: Secondary | ICD-10-CM | POA: Diagnosis not present

## 2012-01-29 DIAGNOSIS — G8929 Other chronic pain: Secondary | ICD-10-CM | POA: Diagnosis not present

## 2012-01-29 DIAGNOSIS — Z6829 Body mass index (BMI) 29.0-29.9, adult: Secondary | ICD-10-CM | POA: Diagnosis not present

## 2012-02-29 DIAGNOSIS — G8929 Other chronic pain: Secondary | ICD-10-CM | POA: Diagnosis not present

## 2012-02-29 DIAGNOSIS — Z6828 Body mass index (BMI) 28.0-28.9, adult: Secondary | ICD-10-CM | POA: Diagnosis not present

## 2012-02-29 DIAGNOSIS — M159 Polyosteoarthritis, unspecified: Secondary | ICD-10-CM | POA: Diagnosis not present

## 2012-02-29 DIAGNOSIS — I10 Essential (primary) hypertension: Secondary | ICD-10-CM | POA: Diagnosis not present

## 2012-02-29 DIAGNOSIS — E785 Hyperlipidemia, unspecified: Secondary | ICD-10-CM | POA: Diagnosis not present

## 2012-03-30 ENCOUNTER — Emergency Department (HOSPITAL_COMMUNITY)
Admission: EM | Admit: 2012-03-30 | Discharge: 2012-03-30 | Disposition: A | Discharge status: Home / Self Care | Payer: BC Managed Care – PPO | Attending: Emergency Medicine | Admitting: Emergency Medicine

## 2012-03-30 ENCOUNTER — Encounter (HOSPITAL_COMMUNITY): Payer: Self-pay | Admitting: *Deleted

## 2012-03-30 DIAGNOSIS — N400 Enlarged prostate without lower urinary tract symptoms: Secondary | ICD-10-CM | POA: Insufficient documentation

## 2012-03-30 DIAGNOSIS — R339 Retention of urine, unspecified: Secondary | ICD-10-CM

## 2012-03-30 DIAGNOSIS — F17200 Nicotine dependence, unspecified, uncomplicated: Secondary | ICD-10-CM | POA: Diagnosis not present

## 2012-03-30 HISTORY — DX: Benign prostatic hyperplasia without lower urinary tract symptoms: N40.0

## 2012-03-30 HISTORY — DX: Pure hypercholesterolemia, unspecified: E78.00

## 2012-03-30 HISTORY — DX: Dorsalgia, unspecified: M54.9

## 2012-03-30 LAB — URINALYSIS, ROUTINE W REFLEX MICROSCOPIC
Bilirubin Urine: NEGATIVE
Hgb urine dipstick: NEGATIVE
Ketones, ur: NEGATIVE mg/dL
Nitrite: NEGATIVE
pH: 7 (ref 5.0–8.0)

## 2012-03-30 NOTE — ED Provider Notes (Signed)
History     CSN: [id number]  Arrival date & time 03/30/12  1446   First MD Initiated Contact with Patient 03/30/12 1522      Chief Complaint  Patient presents with  . Groin Pain  . Urinary Retention     HPI Pt was seen at 1700.  Per pt and spouse, c/o gradual onset and persistence of constant urinary retention since this morning.  Has been associated with suprapubic abd discomfort. States he has a hx of enlarged prostate with several week hx of urinary dribbling and weak urinary stream.  Denies dysuria/hematuria, no back pain, no fevers, no N/V/D, no testicular pain/swelling.    Past Medical History  Diagnosis Date  . Enlarged prostate   . Elevated cholesterol   . Back pain     No past surgical history on file.  History  Substance Use Topics  . Smoking status: Current Every Day Smoker    Types: Cigarettes  . Smokeless tobacco: Not on file  . Alcohol Use:       Review of Systems ROS: Statement: All systems negative except as marked or noted in the HPI; Constitutional: Negative for fever and chills. ; ; Eyes: Negative for eye pain, redness and discharge. ; ; ENMT: Negative for ear pain, hoarseness, nasal congestion, sinus pressure and sore throat. ; ; Cardiovascular: Negative for chest pain, palpitations, diaphoresis, dyspnea and peripheral edema. ; ; Respiratory: Negative for cough, wheezing and stridor. ; ; Gastrointestinal: Negative for nausea, vomiting, diarrhea, blood in stool, hematemesis, jaundice and rectal bleeding. . ; ; Genitourinary: +urinary retention. Negative for dysuria, flank pain and hematuria. ; ; Genital:  No penile drainage or rash, no testicular pain or swelling, no scrotal rash or swelling.;; Musculoskeletal: Negative for back pain and neck pain. Negative for swelling and trauma.; ; Skin: Negative for pruritus, rash, abrasions, blisters, bruising and skin lesion.; ; Neuro: Negative for headache, lightheadedness and neck stiffness. Negative for weakness,  altered level of consciousness , altered mental status, extremity weakness, paresthesias, involuntary movement, seizure and syncope.       Allergies  Review of patient's allergies indicates no known allergies.  Home Medications  No current outpatient prescriptions on file.  BP 146/102  Pulse 84  Temp 97.4 F (36.3 C) (Oral)  Resp 18  Ht 5\' 5"  (1.651 m)  Wt 186 lb (84.369 kg)  BMI 30.95 kg/m2  SpO2 96%  Physical Exam 1710: Physical examination:  Nursing notes reviewed; Vital signs and O2 SAT reviewed;  Constitutional: Well developed, Well nourished, Well hydrated, In no acute distress; Head:  Normocephalic, atraumatic; Eyes: EOMI, PERRL, No scleral icterus; ENMT: Mouth and pharynx normal, Mucous membranes moist; Neck: Supple, Full range of motion, No lymphadenopathy; Cardiovascular: Regular rate and rhythm, No murmur, rub, or gallop; Respiratory: Breath sounds clear & equal bilaterally, No rales, rhonchi, wheezes.  Speaking full sentences with ease, Normal respiratory effort/excursion; Chest: Nontender, Movement normal; Abdomen: Soft, Nontender, Nondistended, Normal bowel sounds, +foley draining clear/yellow urine; Extremities: Pulses normal, No tenderness, No edema, No calf edema or asymmetry.; Neuro: AA&Ox3, Major CN grossly intact.  Speech clear. No gross focal motor or sensory deficits in extremities.; Skin: Color normal, Warm, Dry.   ED Course  Procedures   MDM  MDM Reviewed: nursing note and vitals Interpretation: labs     Results for orders placed during the hospital encounter of 03/30/12  URINALYSIS, ROUTINE W REFLEX MICROSCOPIC      Component Value Range   Color, Urine YELLOW  YELLOW  APPearance CLEAR  CLEAR   Specific Gravity, Urine 1.010  1.005 - 1.030   pH 7.0  5.0 - 8.0   Glucose, UA NEGATIVE  NEGATIVE mg/dL   Hgb urine dipstick NEGATIVE  NEGATIVE   Bilirubin Urine NEGATIVE  NEGATIVE   Ketones, ur NEGATIVE  NEGATIVE mg/dL   Protein, ur NEGATIVE  NEGATIVE  mg/dL   Urobilinogen, UA 0.2  0.0 - 1.0 mg/dL   Nitrite NEGATIVE  NEGATIVE   Leukocytes, UA NEGATIVE  NEGATIVE     1820:  Foley output approx total.  States he feels "much better now" and wants to go home.  Dx testing d/w pt and family.  Questions answered.  Verb understanding, agreeable to d/c home with outpt f/u.          Laray Anger, DO 04/01/12 1317

## 2012-03-30 NOTE — ED Notes (Signed)
Reports onset of suprapubic pain this morning; states has not voided today

## 2012-03-30 NOTE — ED Notes (Signed)
Bladder scanner was used on pt to measure pt's urine volume. Scanner read . 14 french foley was placed. Pt took catheter with no problems. RN made aware.

## 2012-03-31 LAB — URINE CULTURE: Culture: NO GROWTH

## 2012-04-01 DIAGNOSIS — M159 Polyosteoarthritis, unspecified: Secondary | ICD-10-CM | POA: Diagnosis not present

## 2012-04-01 DIAGNOSIS — E785 Hyperlipidemia, unspecified: Secondary | ICD-10-CM | POA: Diagnosis not present

## 2012-04-01 DIAGNOSIS — G8929 Other chronic pain: Secondary | ICD-10-CM | POA: Diagnosis not present

## 2012-04-01 DIAGNOSIS — R339 Retention of urine, unspecified: Secondary | ICD-10-CM | POA: Diagnosis not present

## 2012-04-21 DIAGNOSIS — R339 Retention of urine, unspecified: Secondary | ICD-10-CM | POA: Diagnosis not present

## 2012-04-21 DIAGNOSIS — N400 Enlarged prostate without lower urinary tract symptoms: Secondary | ICD-10-CM | POA: Diagnosis not present

## 2012-05-02 DIAGNOSIS — I10 Essential (primary) hypertension: Secondary | ICD-10-CM | POA: Diagnosis not present

## 2012-05-02 DIAGNOSIS — J449 Chronic obstructive pulmonary disease, unspecified: Secondary | ICD-10-CM | POA: Diagnosis not present

## 2012-05-02 DIAGNOSIS — Z6827 Body mass index (BMI) 27.0-27.9, adult: Secondary | ICD-10-CM | POA: Diagnosis not present

## 2012-05-02 DIAGNOSIS — E785 Hyperlipidemia, unspecified: Secondary | ICD-10-CM | POA: Diagnosis not present

## 2012-05-02 DIAGNOSIS — G8929 Other chronic pain: Secondary | ICD-10-CM | POA: Diagnosis not present

## 2012-05-31 DIAGNOSIS — M159 Polyosteoarthritis, unspecified: Secondary | ICD-10-CM | POA: Diagnosis not present

## 2012-05-31 DIAGNOSIS — I10 Essential (primary) hypertension: Secondary | ICD-10-CM | POA: Diagnosis not present

## 2012-05-31 DIAGNOSIS — E785 Hyperlipidemia, unspecified: Secondary | ICD-10-CM | POA: Diagnosis not present

## 2012-05-31 DIAGNOSIS — Z23 Encounter for immunization: Secondary | ICD-10-CM | POA: Diagnosis not present

## 2012-06-24 DIAGNOSIS — G8929 Other chronic pain: Secondary | ICD-10-CM | POA: Diagnosis not present

## 2012-06-24 DIAGNOSIS — F411 Generalized anxiety disorder: Secondary | ICD-10-CM | POA: Diagnosis not present

## 2012-06-24 DIAGNOSIS — J449 Chronic obstructive pulmonary disease, unspecified: Secondary | ICD-10-CM | POA: Diagnosis not present

## 2012-06-24 DIAGNOSIS — Z6828 Body mass index (BMI) 28.0-28.9, adult: Secondary | ICD-10-CM | POA: Diagnosis not present

## 2012-07-25 DIAGNOSIS — E785 Hyperlipidemia, unspecified: Secondary | ICD-10-CM | POA: Diagnosis not present

## 2012-07-25 DIAGNOSIS — G8929 Other chronic pain: Secondary | ICD-10-CM | POA: Diagnosis not present

## 2012-07-25 DIAGNOSIS — I10 Essential (primary) hypertension: Secondary | ICD-10-CM | POA: Diagnosis not present

## 2012-07-25 DIAGNOSIS — Z6829 Body mass index (BMI) 29.0-29.9, adult: Secondary | ICD-10-CM | POA: Diagnosis not present

## 2012-07-26 DIAGNOSIS — Z79899 Other long term (current) drug therapy: Secondary | ICD-10-CM | POA: Diagnosis not present

## 2012-08-25 DIAGNOSIS — I10 Essential (primary) hypertension: Secondary | ICD-10-CM | POA: Diagnosis not present

## 2012-08-25 DIAGNOSIS — E785 Hyperlipidemia, unspecified: Secondary | ICD-10-CM | POA: Diagnosis not present

## 2012-08-25 DIAGNOSIS — Z6828 Body mass index (BMI) 28.0-28.9, adult: Secondary | ICD-10-CM | POA: Diagnosis not present

## 2012-09-16 DIAGNOSIS — G8929 Other chronic pain: Secondary | ICD-10-CM | POA: Diagnosis not present

## 2012-09-16 DIAGNOSIS — J449 Chronic obstructive pulmonary disease, unspecified: Secondary | ICD-10-CM | POA: Diagnosis not present

## 2012-09-16 DIAGNOSIS — Z6828 Body mass index (BMI) 28.0-28.9, adult: Secondary | ICD-10-CM | POA: Diagnosis not present

## 2012-10-24 DIAGNOSIS — J438 Other emphysema: Secondary | ICD-10-CM | POA: Diagnosis not present

## 2012-10-24 DIAGNOSIS — Z719 Counseling, unspecified: Secondary | ICD-10-CM | POA: Diagnosis not present

## 2012-10-24 DIAGNOSIS — Z6827 Body mass index (BMI) 27.0-27.9, adult: Secondary | ICD-10-CM | POA: Diagnosis not present

## 2012-10-24 DIAGNOSIS — I10 Essential (primary) hypertension: Secondary | ICD-10-CM | POA: Diagnosis not present

## 2012-10-24 DIAGNOSIS — E785 Hyperlipidemia, unspecified: Secondary | ICD-10-CM | POA: Diagnosis not present

## 2012-10-24 DIAGNOSIS — G8929 Other chronic pain: Secondary | ICD-10-CM | POA: Diagnosis not present

## 2012-11-18 DIAGNOSIS — Z6826 Body mass index (BMI) 26.0-26.9, adult: Secondary | ICD-10-CM | POA: Diagnosis not present

## 2012-11-18 DIAGNOSIS — G8929 Other chronic pain: Secondary | ICD-10-CM | POA: Diagnosis not present

## 2012-11-18 DIAGNOSIS — E785 Hyperlipidemia, unspecified: Secondary | ICD-10-CM | POA: Diagnosis not present

## 2012-11-18 DIAGNOSIS — I10 Essential (primary) hypertension: Secondary | ICD-10-CM | POA: Diagnosis not present

## 2012-12-23 DIAGNOSIS — H6980 Other specified disorders of Eustachian tube, unspecified ear: Secondary | ICD-10-CM | POA: Diagnosis not present

## 2012-12-23 DIAGNOSIS — H66009 Acute suppurative otitis media without spontaneous rupture of ear drum, unspecified ear: Secondary | ICD-10-CM | POA: Diagnosis not present

## 2012-12-23 DIAGNOSIS — J449 Chronic obstructive pulmonary disease, unspecified: Secondary | ICD-10-CM | POA: Diagnosis not present

## 2012-12-23 DIAGNOSIS — Z719 Counseling, unspecified: Secondary | ICD-10-CM | POA: Diagnosis not present

## 2012-12-23 DIAGNOSIS — Z6826 Body mass index (BMI) 26.0-26.9, adult: Secondary | ICD-10-CM | POA: Diagnosis not present

## 2012-12-23 DIAGNOSIS — M159 Polyosteoarthritis, unspecified: Secondary | ICD-10-CM | POA: Diagnosis not present

## 2013-01-20 DIAGNOSIS — I10 Essential (primary) hypertension: Secondary | ICD-10-CM | POA: Diagnosis not present

## 2013-01-20 DIAGNOSIS — G8929 Other chronic pain: Secondary | ICD-10-CM | POA: Diagnosis not present

## 2013-01-20 DIAGNOSIS — N400 Enlarged prostate without lower urinary tract symptoms: Secondary | ICD-10-CM | POA: Diagnosis not present

## 2013-01-20 DIAGNOSIS — J449 Chronic obstructive pulmonary disease, unspecified: Secondary | ICD-10-CM | POA: Diagnosis not present

## 2013-01-20 DIAGNOSIS — Z6826 Body mass index (BMI) 26.0-26.9, adult: Secondary | ICD-10-CM | POA: Diagnosis not present

## 2013-01-31 DIAGNOSIS — F39 Unspecified mood [affective] disorder: Secondary | ICD-10-CM | POA: Diagnosis not present

## 2013-02-22 ENCOUNTER — Other Ambulatory Visit (HOSPITAL_COMMUNITY): Payer: Self-pay | Admitting: Internal Medicine

## 2013-02-22 ENCOUNTER — Ambulatory Visit (HOSPITAL_COMMUNITY)
Admission: RE | Admit: 2013-02-22 | Discharge: 2013-02-22 | Disposition: A | Discharge status: Home / Self Care | Payer: Medicare Other | Source: Ambulatory Visit | Attending: Internal Medicine | Admitting: Internal Medicine

## 2013-02-22 DIAGNOSIS — IMO0002 Reserved for concepts with insufficient information to code with codable children: Secondary | ICD-10-CM | POA: Diagnosis not present

## 2013-02-22 DIAGNOSIS — G8929 Other chronic pain: Secondary | ICD-10-CM | POA: Diagnosis not present

## 2013-02-22 DIAGNOSIS — J984 Other disorders of lung: Secondary | ICD-10-CM | POA: Diagnosis not present

## 2013-02-22 DIAGNOSIS — R05 Cough: Secondary | ICD-10-CM

## 2013-02-22 DIAGNOSIS — R059 Cough, unspecified: Secondary | ICD-10-CM | POA: Diagnosis not present

## 2013-02-22 DIAGNOSIS — J209 Acute bronchitis, unspecified: Secondary | ICD-10-CM | POA: Diagnosis not present

## 2013-03-24 DIAGNOSIS — Z6826 Body mass index (BMI) 26.0-26.9, adult: Secondary | ICD-10-CM | POA: Diagnosis not present

## 2013-03-24 DIAGNOSIS — G8929 Other chronic pain: Secondary | ICD-10-CM | POA: Diagnosis not present

## 2013-04-05 DIAGNOSIS — F329 Major depressive disorder, single episode, unspecified: Secondary | ICD-10-CM | POA: Diagnosis not present

## 2013-04-21 DIAGNOSIS — Z23 Encounter for immunization: Secondary | ICD-10-CM | POA: Diagnosis not present

## 2013-04-21 DIAGNOSIS — Z6828 Body mass index (BMI) 28.0-28.9, adult: Secondary | ICD-10-CM | POA: Diagnosis not present

## 2013-04-21 DIAGNOSIS — G8929 Other chronic pain: Secondary | ICD-10-CM | POA: Diagnosis not present

## 2013-05-24 DIAGNOSIS — Z6829 Body mass index (BMI) 29.0-29.9, adult: Secondary | ICD-10-CM | POA: Diagnosis not present

## 2013-05-24 DIAGNOSIS — G8929 Other chronic pain: Secondary | ICD-10-CM | POA: Diagnosis not present

## 2013-06-21 DIAGNOSIS — Z6829 Body mass index (BMI) 29.0-29.9, adult: Secondary | ICD-10-CM | POA: Diagnosis not present

## 2013-06-21 DIAGNOSIS — G253 Myoclonus: Secondary | ICD-10-CM | POA: Diagnosis not present

## 2013-06-21 DIAGNOSIS — G8929 Other chronic pain: Secondary | ICD-10-CM | POA: Diagnosis not present

## 2013-06-26 DIAGNOSIS — F329 Major depressive disorder, single episode, unspecified: Secondary | ICD-10-CM | POA: Diagnosis not present

## 2013-07-20 DIAGNOSIS — G8929 Other chronic pain: Secondary | ICD-10-CM | POA: Diagnosis not present

## 2013-08-17 DIAGNOSIS — I10 Essential (primary) hypertension: Secondary | ICD-10-CM | POA: Diagnosis not present

## 2013-08-17 DIAGNOSIS — Z6830 Body mass index (BMI) 30.0-30.9, adult: Secondary | ICD-10-CM | POA: Diagnosis not present

## 2013-08-17 DIAGNOSIS — G8929 Other chronic pain: Secondary | ICD-10-CM | POA: Diagnosis not present

## 2013-08-18 DIAGNOSIS — Z125 Encounter for screening for malignant neoplasm of prostate: Secondary | ICD-10-CM | POA: Diagnosis not present

## 2013-08-18 DIAGNOSIS — Z79899 Other long term (current) drug therapy: Secondary | ICD-10-CM | POA: Diagnosis not present

## 2013-08-18 DIAGNOSIS — R7309 Other abnormal glucose: Secondary | ICD-10-CM | POA: Diagnosis not present

## 2013-08-18 DIAGNOSIS — I10 Essential (primary) hypertension: Secondary | ICD-10-CM | POA: Diagnosis not present

## 2013-09-14 DIAGNOSIS — Z6830 Body mass index (BMI) 30.0-30.9, adult: Secondary | ICD-10-CM | POA: Diagnosis not present

## 2013-09-14 DIAGNOSIS — G8929 Other chronic pain: Secondary | ICD-10-CM | POA: Diagnosis not present

## 2013-09-26 DIAGNOSIS — F3289 Other specified depressive episodes: Secondary | ICD-10-CM | POA: Diagnosis not present

## 2013-09-26 DIAGNOSIS — F329 Major depressive disorder, single episode, unspecified: Secondary | ICD-10-CM | POA: Diagnosis not present

## 2013-10-23 DIAGNOSIS — Z6828 Body mass index (BMI) 28.0-28.9, adult: Secondary | ICD-10-CM | POA: Diagnosis not present

## 2013-10-23 DIAGNOSIS — G8929 Other chronic pain: Secondary | ICD-10-CM | POA: Diagnosis not present

## 2013-10-23 DIAGNOSIS — H60399 Other infective otitis externa, unspecified ear: Secondary | ICD-10-CM | POA: Diagnosis not present

## 2013-11-24 DIAGNOSIS — J449 Chronic obstructive pulmonary disease, unspecified: Secondary | ICD-10-CM | POA: Diagnosis not present

## 2013-11-24 DIAGNOSIS — G8929 Other chronic pain: Secondary | ICD-10-CM | POA: Diagnosis not present

## 2013-11-24 DIAGNOSIS — Z6830 Body mass index (BMI) 30.0-30.9, adult: Secondary | ICD-10-CM | POA: Diagnosis not present

## 2013-12-18 DIAGNOSIS — F329 Major depressive disorder, single episode, unspecified: Secondary | ICD-10-CM | POA: Diagnosis not present

## 2013-12-18 DIAGNOSIS — F3289 Other specified depressive episodes: Secondary | ICD-10-CM | POA: Diagnosis not present

## 2013-12-22 DIAGNOSIS — G8929 Other chronic pain: Secondary | ICD-10-CM | POA: Diagnosis not present

## 2013-12-22 DIAGNOSIS — G253 Myoclonus: Secondary | ICD-10-CM | POA: Diagnosis not present

## 2013-12-22 DIAGNOSIS — Z6828 Body mass index (BMI) 28.0-28.9, adult: Secondary | ICD-10-CM | POA: Diagnosis not present

## 2014-01-01 DIAGNOSIS — R5383 Other fatigue: Secondary | ICD-10-CM | POA: Diagnosis not present

## 2014-01-01 DIAGNOSIS — R5381 Other malaise: Secondary | ICD-10-CM | POA: Diagnosis not present

## 2014-01-01 DIAGNOSIS — R259 Unspecified abnormal involuntary movements: Secondary | ICD-10-CM | POA: Diagnosis not present

## 2014-01-01 DIAGNOSIS — G4733 Obstructive sleep apnea (adult) (pediatric): Secondary | ICD-10-CM | POA: Diagnosis not present

## 2014-01-01 DIAGNOSIS — G4700 Insomnia, unspecified: Secondary | ICD-10-CM | POA: Diagnosis not present

## 2014-01-17 ENCOUNTER — Other Ambulatory Visit: Payer: Self-pay

## 2014-01-17 DIAGNOSIS — G4730 Sleep apnea, unspecified: Secondary | ICD-10-CM

## 2014-01-23 DIAGNOSIS — G8929 Other chronic pain: Secondary | ICD-10-CM | POA: Diagnosis not present

## 2014-01-23 DIAGNOSIS — Z6830 Body mass index (BMI) 30.0-30.9, adult: Secondary | ICD-10-CM | POA: Diagnosis not present

## 2014-02-23 ENCOUNTER — Other Ambulatory Visit (HOSPITAL_COMMUNITY): Payer: Self-pay | Admitting: Internal Medicine

## 2014-02-23 ENCOUNTER — Ambulatory Visit (HOSPITAL_COMMUNITY)
Admission: RE | Admit: 2014-02-23 | Discharge: 2014-02-23 | Disposition: A | Discharge status: Home / Self Care | Payer: Medicare Other | Source: Ambulatory Visit | Attending: Internal Medicine | Admitting: Internal Medicine

## 2014-02-23 DIAGNOSIS — S59919A Unspecified injury of unspecified forearm, initial encounter: Principal | ICD-10-CM

## 2014-02-23 DIAGNOSIS — H81399 Other peripheral vertigo, unspecified ear: Secondary | ICD-10-CM | POA: Diagnosis not present

## 2014-02-23 DIAGNOSIS — S59909A Unspecified injury of unspecified elbow, initial encounter: Secondary | ICD-10-CM

## 2014-02-23 DIAGNOSIS — M25529 Pain in unspecified elbow: Secondary | ICD-10-CM | POA: Insufficient documentation

## 2014-02-23 DIAGNOSIS — S6990XA Unspecified injury of unspecified wrist, hand and finger(s), initial encounter: Secondary | ICD-10-CM | POA: Diagnosis not present

## 2014-02-23 DIAGNOSIS — X58XXXA Exposure to other specified factors, initial encounter: Secondary | ICD-10-CM | POA: Diagnosis not present

## 2014-02-23 DIAGNOSIS — G8929 Other chronic pain: Secondary | ICD-10-CM | POA: Diagnosis not present

## 2014-02-23 DIAGNOSIS — Z6831 Body mass index (BMI) 31.0-31.9, adult: Secondary | ICD-10-CM | POA: Diagnosis not present

## 2014-02-25 ENCOUNTER — Ambulatory Visit: Payer: Medicare Other

## 2014-02-28 ENCOUNTER — Ambulatory Visit: Payer: Medicare Other | Attending: Neurology | Admitting: Sleep Medicine

## 2014-02-28 VITALS — Ht 65.0 in | Wt 180.0 lb

## 2014-02-28 DIAGNOSIS — G4733 Obstructive sleep apnea (adult) (pediatric): Secondary | ICD-10-CM | POA: Insufficient documentation

## 2014-02-28 DIAGNOSIS — G4730 Sleep apnea, unspecified: Secondary | ICD-10-CM | POA: Diagnosis not present

## 2014-02-28 DIAGNOSIS — G4710 Hypersomnia, unspecified: Secondary | ICD-10-CM | POA: Diagnosis not present

## 2014-03-01 NOTE — Sleep Study (Signed)
  Alma A. Merlene Laughter, MD     [email or web address]        NOCTURNAL POLYSOMNOGRAM    LOCATION: SLEEP LAB FACILITY:    PHYSICIAN: Shyan Scalisi A. Merlene Laughter, M.D.   DATE OF STUDY: 02/28/2014.   REFERRING PHYSICIAN: Edgar Reisz.  INDICATIONS: This is a 58 year old who presents with snoring, fatigue and insomnia. There is also daytime sleepiness.  MEDICATIONS:  Prior to Admission medications   Medication Sig Start Date End Date Taking? Authorizing Provider  ALPRAZolam Duanne Moron) 1 MG tablet Take 1 mg by mouth at bedtime as needed. For sleep    Historical Provider, MD  ARIPiprazole (ABILIFY) 10 MG tablet Take 10 mg by mouth daily.    Historical Provider, MD  citalopram (CELEXA) 40 MG tablet Take 40 mg by mouth daily.    Historical Provider, MD  cyclobenzaprine (FLEXERIL) 10 MG tablet Take 10 mg by mouth 3 (three) times daily as needed.    Historical Provider, MD  fish oil-omega-3 fatty acids 1000 MG capsule Take 1 g by mouth daily.    Historical Provider, MD  loratadine (CLARITIN) 10 MG tablet Take 10 mg by mouth daily.    Historical Provider, MD  Multiple Vitamin (MULTIVITAMIN WITH MINERALS) TABS Take 1 tablet by mouth daily.    Historical Provider, MD  oxyCODONE (OXYCONTIN) 80 MG 12 hr tablet Take 80 mg by mouth 3 (three) times daily.    Historical Provider, MD  traZODone (DESYREL) 100 MG tablet Take 100-200 mg by mouth at bedtime as needed. For sleep    Historical Provider, MD      EPWORTH SLEEPINESS SCALE:  0.   BMI: 30.   ARCHITECTURAL SUMMARY: Total recording time was 402 minutes. Sleep efficiency 46 %. Sleep latency 52 minutes. REM latency 71 minutes. Stage NI 9 %, N2 50 % and N3 26 % and REM sleep 15 %.    RESPIRATORY DATA:  Baseline oxygen saturation is 96 %. The lowest saturation is 84 %. The diagnostic AHI is 8. The RDI is 8. The REM AHI is 45.  LIMB MOVEMENT SUMMARY: PLM index 0.   ELECTROCARDIOGRAM SUMMARY: Average heart rate is 75 with no  significant dysrhythmias observed.   IMPRESSION:  1. Mild mostly REM related obstructive sleep apnea syndrome not requiring positive pressure treatment.  Thanks for this referral.  Haizley Cannella A. Merlene Laughter, M.D. Diplomat, [employer] adviser of Sleep Medicine.

## 2014-03-13 DIAGNOSIS — F3289 Other specified depressive episodes: Secondary | ICD-10-CM | POA: Diagnosis not present

## 2014-03-13 DIAGNOSIS — F329 Major depressive disorder, single episode, unspecified: Secondary | ICD-10-CM | POA: Diagnosis not present

## 2014-03-20 DIAGNOSIS — R259 Unspecified abnormal involuntary movements: Secondary | ICD-10-CM | POA: Diagnosis not present

## 2014-03-20 DIAGNOSIS — R5383 Other fatigue: Secondary | ICD-10-CM | POA: Diagnosis not present

## 2014-03-20 DIAGNOSIS — R5381 Other malaise: Secondary | ICD-10-CM | POA: Diagnosis not present

## 2014-03-20 DIAGNOSIS — G4710 Hypersomnia, unspecified: Secondary | ICD-10-CM | POA: Diagnosis not present

## 2014-03-20 DIAGNOSIS — G4700 Insomnia, unspecified: Secondary | ICD-10-CM | POA: Diagnosis not present

## 2014-03-26 DIAGNOSIS — Z6831 Body mass index (BMI) 31.0-31.9, adult: Secondary | ICD-10-CM | POA: Diagnosis not present

## 2014-03-26 DIAGNOSIS — G8929 Other chronic pain: Secondary | ICD-10-CM | POA: Diagnosis not present

## 2014-04-19 DIAGNOSIS — Z23 Encounter for immunization: Secondary | ICD-10-CM | POA: Diagnosis not present

## 2014-04-19 DIAGNOSIS — E6609 Other obesity due to excess calories: Secondary | ICD-10-CM | POA: Diagnosis not present

## 2014-04-19 DIAGNOSIS — G894 Chronic pain syndrome: Secondary | ICD-10-CM | POA: Diagnosis not present

## 2014-04-19 DIAGNOSIS — Z6830 Body mass index (BMI) 30.0-30.9, adult: Secondary | ICD-10-CM | POA: Diagnosis not present

## 2014-05-25 DIAGNOSIS — R609 Edema, unspecified: Secondary | ICD-10-CM | POA: Diagnosis not present

## 2014-05-25 DIAGNOSIS — G4700 Insomnia, unspecified: Secondary | ICD-10-CM | POA: Diagnosis not present

## 2014-05-25 DIAGNOSIS — Z6831 Body mass index (BMI) 31.0-31.9, adult: Secondary | ICD-10-CM | POA: Diagnosis not present

## 2014-05-25 DIAGNOSIS — K4090 Unilateral inguinal hernia, without obstruction or gangrene, not specified as recurrent: Secondary | ICD-10-CM | POA: Diagnosis not present

## 2014-05-28 ENCOUNTER — Other Ambulatory Visit (HOSPITAL_COMMUNITY): Payer: Self-pay | Admitting: Internal Medicine

## 2014-05-28 DIAGNOSIS — R109 Unspecified abdominal pain: Secondary | ICD-10-CM

## 2014-05-28 DIAGNOSIS — R609 Edema, unspecified: Secondary | ICD-10-CM | POA: Diagnosis not present

## 2014-05-30 ENCOUNTER — Ambulatory Visit (HOSPITAL_COMMUNITY)
Admission: RE | Admit: 2014-05-30 | Discharge: 2014-05-30 | Disposition: A | Discharge status: Home / Self Care | Payer: Medicare Other | Source: Ambulatory Visit | Attending: Internal Medicine | Admitting: Internal Medicine

## 2014-05-30 DIAGNOSIS — K5790 Diverticulosis of intestine, part unspecified, without perforation or abscess without bleeding: Secondary | ICD-10-CM | POA: Insufficient documentation

## 2014-05-30 DIAGNOSIS — R109 Unspecified abdominal pain: Secondary | ICD-10-CM

## 2014-05-30 DIAGNOSIS — R1032 Left lower quadrant pain: Secondary | ICD-10-CM | POA: Diagnosis present

## 2014-05-30 MED ORDER — IOHEXOL 300 MG/ML  SOLN
100.0000 mL | Freq: Once | INTRAMUSCULAR | Status: AC | PRN
  Administered 2014-05-30: 100 mL via INTRAVENOUS

## 2014-06-04 DIAGNOSIS — E781 Pure hyperglyceridemia: Secondary | ICD-10-CM | POA: Diagnosis not present

## 2014-06-04 DIAGNOSIS — Z79899 Other long term (current) drug therapy: Secondary | ICD-10-CM | POA: Diagnosis not present

## 2014-06-07 DIAGNOSIS — F329 Major depressive disorder, single episode, unspecified: Secondary | ICD-10-CM | POA: Diagnosis not present

## 2014-06-18 DIAGNOSIS — Z6831 Body mass index (BMI) 31.0-31.9, adult: Secondary | ICD-10-CM | POA: Diagnosis not present

## 2014-06-18 DIAGNOSIS — G894 Chronic pain syndrome: Secondary | ICD-10-CM | POA: Diagnosis not present

## 2014-06-18 DIAGNOSIS — R739 Hyperglycemia, unspecified: Secondary | ICD-10-CM | POA: Diagnosis not present

## 2014-06-18 DIAGNOSIS — E249 Cushing's syndrome, unspecified: Secondary | ICD-10-CM | POA: Diagnosis not present

## 2014-06-18 DIAGNOSIS — M1991 Primary osteoarthritis, unspecified site: Secondary | ICD-10-CM | POA: Diagnosis not present

## 2014-08-24 ENCOUNTER — Inpatient Hospital Stay (HOSPITAL_COMMUNITY)
Admission: EM | Admit: 2014-08-24 | Discharge: 2014-08-26 | DRG: 189 | Disposition: A | Discharge status: Home / Self Care | Payer: Medicare Other | Attending: Family Medicine | Admitting: Family Medicine

## 2014-08-24 ENCOUNTER — Emergency Department (HOSPITAL_COMMUNITY): Payer: Medicare Other

## 2014-08-24 ENCOUNTER — Encounter (HOSPITAL_COMMUNITY): Payer: Self-pay | Admitting: *Deleted

## 2014-08-24 DIAGNOSIS — E8881 Metabolic syndrome: Secondary | ICD-10-CM | POA: Diagnosis present

## 2014-08-24 DIAGNOSIS — J449 Chronic obstructive pulmonary disease, unspecified: Secondary | ICD-10-CM | POA: Diagnosis present

## 2014-08-24 DIAGNOSIS — G894 Chronic pain syndrome: Secondary | ICD-10-CM | POA: Diagnosis not present

## 2014-08-24 DIAGNOSIS — Z87891 Personal history of nicotine dependence: Secondary | ICD-10-CM

## 2014-08-24 DIAGNOSIS — N400 Enlarged prostate without lower urinary tract symptoms: Secondary | ICD-10-CM | POA: Diagnosis present

## 2014-08-24 DIAGNOSIS — K5790 Diverticulosis of intestine, part unspecified, without perforation or abscess without bleeding: Secondary | ICD-10-CM | POA: Diagnosis present

## 2014-08-24 DIAGNOSIS — Z8249 Family history of ischemic heart disease and other diseases of the circulatory system: Secondary | ICD-10-CM

## 2014-08-24 DIAGNOSIS — J9601 Acute respiratory failure with hypoxia: Secondary | ICD-10-CM | POA: Diagnosis not present

## 2014-08-24 DIAGNOSIS — Z6834 Body mass index (BMI) 34.0-34.9, adult: Secondary | ICD-10-CM | POA: Diagnosis not present

## 2014-08-24 DIAGNOSIS — K5730 Diverticulosis of large intestine without perforation or abscess without bleeding: Secondary | ICD-10-CM | POA: Diagnosis not present

## 2014-08-24 DIAGNOSIS — E669 Obesity, unspecified: Secondary | ICD-10-CM | POA: Diagnosis present

## 2014-08-24 DIAGNOSIS — E780 Pure hypercholesterolemia: Secondary | ICD-10-CM | POA: Diagnosis present

## 2014-08-24 DIAGNOSIS — G92 Toxic encephalopathy: Secondary | ICD-10-CM | POA: Diagnosis present

## 2014-08-24 DIAGNOSIS — E872 Acidosis: Secondary | ICD-10-CM | POA: Diagnosis not present

## 2014-08-24 DIAGNOSIS — Z809 Family history of malignant neoplasm, unspecified: Secondary | ICD-10-CM

## 2014-08-24 DIAGNOSIS — E782 Mixed hyperlipidemia: Secondary | ICD-10-CM | POA: Diagnosis not present

## 2014-08-24 DIAGNOSIS — I10 Essential (primary) hypertension: Secondary | ICD-10-CM | POA: Diagnosis present

## 2014-08-24 DIAGNOSIS — M1991 Primary osteoarthritis, unspecified site: Secondary | ICD-10-CM | POA: Diagnosis not present

## 2014-08-24 DIAGNOSIS — I471 Supraventricular tachycardia: Secondary | ICD-10-CM | POA: Diagnosis not present

## 2014-08-24 DIAGNOSIS — J9602 Acute respiratory failure with hypercapnia: Principal | ICD-10-CM | POA: Diagnosis present

## 2014-08-24 DIAGNOSIS — R000 Tachycardia, unspecified: Secondary | ICD-10-CM | POA: Diagnosis present

## 2014-08-24 DIAGNOSIS — R05 Cough: Secondary | ICD-10-CM | POA: Diagnosis not present

## 2014-08-24 DIAGNOSIS — G928 Other toxic encephalopathy: Secondary | ICD-10-CM | POA: Diagnosis present

## 2014-08-24 DIAGNOSIS — J9600 Acute respiratory failure, unspecified whether with hypoxia or hypercapnia: Secondary | ICD-10-CM | POA: Diagnosis present

## 2014-08-24 DIAGNOSIS — Z6832 Body mass index (BMI) 32.0-32.9, adult: Secondary | ICD-10-CM | POA: Diagnosis not present

## 2014-08-24 DIAGNOSIS — R0602 Shortness of breath: Secondary | ICD-10-CM

## 2014-08-24 DIAGNOSIS — I517 Cardiomegaly: Secondary | ICD-10-CM | POA: Diagnosis not present

## 2014-08-24 LAB — CBC WITH DIFFERENTIAL/PLATELET
BASOS PCT: 0 % (ref 0–1)
Basophils Absolute: 0 10*3/uL (ref 0.0–0.1)
EOS PCT: 2 % (ref 0–5)
Eosinophils Absolute: 0.3 10*3/uL (ref 0.0–0.7)
HCT: 37.8 % — ABNORMAL LOW (ref 39.0–52.0)
Hemoglobin: 12.2 g/dL — ABNORMAL LOW (ref 13.0–17.0)
LYMPHS ABS: 2.2 10*3/uL (ref 0.7–4.0)
Lymphocytes Relative: 17 % (ref 12–46)
MCH: 30.7 pg (ref 26.0–34.0)
MCHC: 32.3 g/dL (ref 30.0–36.0)
MCV: 95 fL (ref 78.0–100.0)
MONO ABS: 1.1 10*3/uL — AB (ref 0.1–1.0)
Monocytes Relative: 8 % (ref 3–12)
Neutro Abs: 9.4 10*3/uL — ABNORMAL HIGH (ref 1.7–7.7)
Neutrophils Relative %: 73 % (ref 43–77)
Platelets: 189 10*3/uL (ref 150–400)
RBC: 3.98 MIL/uL — ABNORMAL LOW (ref 4.22–5.81)
RDW: 13 % (ref 11.5–15.5)
WBC: 13 10*3/uL — ABNORMAL HIGH (ref 4.0–10.5)

## 2014-08-24 LAB — BLOOD GAS, ARTERIAL
ACID-BASE EXCESS: 7.5 mmol/L — AB (ref 0.0–2.0)
Bicarbonate: 33.1 mEq/L — ABNORMAL HIGH (ref 20.0–24.0)
DRAWN BY: 234301
O2 CONTENT: 2 L/min
O2 Saturation: 94.4 %
Patient temperature: 37
TCO2: 30.1 mmol/L (ref 0–100)
pCO2 arterial: 61.4 mmHg (ref 35.0–45.0)
pH, Arterial: 7.35 (ref 7.350–7.450)
pO2, Arterial: 73.1 mmHg — ABNORMAL LOW (ref 80.0–100.0)

## 2014-08-24 LAB — BASIC METABOLIC PANEL
ANION GAP: 5 (ref 5–15)
BUN: 7 mg/dL (ref 6–23)
CO2: 34 mmol/L — AB (ref 19–32)
Calcium: 8.7 mg/dL (ref 8.4–10.5)
Chloride: 97 mmol/L (ref 96–112)
Creatinine, Ser: 1.02 mg/dL (ref 0.50–1.35)
GFR calc Af Amer: >90 mL/min (ref >90)
GFR calc non Af Amer: 79 mL/min — ABNORMAL LOW (ref >90)
Glucose, Bld: 122 mg/dL — ABNORMAL HIGH (ref 70–99)
Potassium: 4.1 mmol/L (ref 3.5–5.1)
Sodium: 136 mmol/L (ref 135–145)

## 2014-08-24 LAB — BRAIN NATRIURETIC PEPTIDE: B NATRIURETIC PEPTIDE 5: 55 pg/mL (ref 0.0–100.0)

## 2014-08-24 MED ORDER — ALBUTEROL SULFATE (2.5 MG/3ML) 0.083% IN NEBU
2.5000 mg | INHALATION_SOLUTION | Freq: Once | RESPIRATORY_TRACT | Status: AC
  Administered 2014-08-24: 2.5 mg via RESPIRATORY_TRACT
  Filled 2014-08-24: qty 3

## 2014-08-24 MED ORDER — CITALOPRAM HYDROBROMIDE 20 MG PO TABS
40.0000 mg | ORAL_TABLET | Freq: Every day | ORAL | Status: DC
  Administered 2014-08-24 – 2014-08-26 (×3): 40 mg via ORAL
  Filled 2014-08-24 (×3): qty 2

## 2014-08-24 MED ORDER — ALUM & MAG HYDROXIDE-SIMETH 200-200-20 MG/5ML PO SUSP
30.0000 mL | Freq: Once | ORAL | Status: AC
  Administered 2014-08-24: 30 mL via ORAL
  Filled 2014-08-24: qty 30

## 2014-08-24 MED ORDER — HEPARIN SODIUM (PORCINE) 5000 UNIT/ML IJ SOLN
5000.0000 [IU] | Freq: Three times a day (TID) | INTRAMUSCULAR | Status: DC
  Administered 2014-08-24 (×2): 5000 [IU] via SUBCUTANEOUS
  Filled 2014-08-24 (×3): qty 1

## 2014-08-24 MED ORDER — ALPRAZOLAM 1 MG PO TABS: 1.0000 mg | ORAL_TABLET | Freq: Every evening | ORAL | Status: DC | PRN

## 2014-08-24 MED ORDER — CETYLPYRIDINIUM CHLORIDE 0.05 % MT LIQD
7.0000 mL | Freq: Two times a day (BID) | OROMUCOSAL | Status: DC
  Administered 2014-08-24 – 2014-08-26 (×4): 7 mL via OROMUCOSAL

## 2014-08-24 MED ORDER — ALBUTEROL SULFATE (2.5 MG/3ML) 0.083% IN NEBU: 2.5000 mg | INHALATION_SOLUTION | Freq: Four times a day (QID) | RESPIRATORY_TRACT | Status: DC | PRN

## 2014-08-24 MED ORDER — IPRATROPIUM-ALBUTEROL 0.5-2.5 (3) MG/3ML IN SOLN
3.0000 mL | Freq: Once | RESPIRATORY_TRACT | Status: AC
  Administered 2014-08-24: 3 mL via RESPIRATORY_TRACT
  Filled 2014-08-24: qty 3

## 2014-08-24 MED ORDER — METHYLPREDNISOLONE SODIUM SUCC 125 MG IJ SOLR
INTRAMUSCULAR | Status: AC
  Filled 2014-08-24: qty 2

## 2014-08-24 MED ORDER — ADULT MULTIVITAMIN W/MINERALS CH
1.0000 | ORAL_TABLET | Freq: Every day | ORAL | Status: DC
Start: 2014-08-24 — End: 2014-08-26
  Administered 2014-08-24 – 2014-08-26 (×3): 1 via ORAL
  Filled 2014-08-24 (×3): qty 1

## 2014-08-24 MED ORDER — PREDNISONE 20 MG PO TABS
60.0000 mg | ORAL_TABLET | Freq: Every day | ORAL | Status: DC
  Administered 2014-08-25 – 2014-08-26 (×2): 60 mg via ORAL
  Filled 2014-08-24 (×2): qty 3

## 2014-08-24 MED ORDER — OXYCODONE HCL ER 80 MG PO T12A
80.0000 mg | EXTENDED_RELEASE_TABLET | Freq: Three times a day (TID) | ORAL | Status: DC
  Administered 2014-08-24 – 2014-08-26 (×5): 80 mg via ORAL
  Filled 2014-08-24: qty 1
  Filled 2014-08-24 (×3): qty 4
  Filled 2014-08-24: qty 1
  Filled 2014-08-24: qty 4

## 2014-08-24 MED ORDER — SODIUM CHLORIDE 0.9 % IJ SOLN
3.0000 mL | Freq: Two times a day (BID) | INTRAMUSCULAR | Status: DC
  Administered 2014-08-25: 3 mL via INTRAVENOUS

## 2014-08-24 MED ORDER — METHYLPREDNISOLONE SODIUM SUCC 125 MG IJ SOLR
125.0000 mg | INTRAMUSCULAR | Status: AC
  Administered 2014-08-24: 125 mg via INTRAVENOUS
  Filled 2014-08-24: qty 2

## 2014-08-24 MED ORDER — MOMETASONE FURO-FORMOTEROL FUM 100-5 MCG/ACT IN AERO
2.0000 | INHALATION_SPRAY | Freq: Two times a day (BID) | RESPIRATORY_TRACT | Status: DC
  Administered 2014-08-24 – 2014-08-26 (×4): 2 via RESPIRATORY_TRACT
  Filled 2014-08-24: qty 8.8

## 2014-08-24 MED ORDER — LORATADINE 10 MG PO TABS
10.0000 mg | ORAL_TABLET | Freq: Every day | ORAL | Status: DC
  Administered 2014-08-24 – 2014-08-26 (×3): 10 mg via ORAL
  Filled 2014-08-24 (×3): qty 1

## 2014-08-24 NOTE — ED Notes (Signed)
Report given to Santiago Glad, RN unit 300. Ready to receive patient.

## 2014-08-24 NOTE — ED Notes (Addendum)
Sent from Dr [employer] office. For eval of sob and confusion. "wheezing"  Cough, brown , yellow and red . sputum.  Chronic pain. In back

## 2014-08-24 NOTE — H&P (Addendum)
[facility] History and Physical  Victor Fields WCB:[id number] DOB: 11-28-1955 DOA: 08/24/2014  Referring physician: ED PCP: Glo Herring., MD  Specialists: none  Chief Complaint: SOB, Confusion  HPI:    59 y/o ? Known history chronic pain secondary to lumbar degenerative disc disease/sacroiliac joint disorder status post injection currently on OxyContin 80 mg 3 times a day, prior history of pyelonephritis + prostatitis, hypertension, syncopal episode 2004, bipolar, Body mass index is 34.31 kg/(m^2).  Patient presented to his primary care physician office 2/18 with a one to two-week history of shortness of breath cough and congestion. Multiple family members have had similar symptoms and all of them have been treated with antibiotics. It is unclear whether they were tested for strep throat or H influenza. Patient also has had some confusion over this period of time which culminated in him worsening to the point that when he was passing urine or when he was moving around he would see sleepy. He would also be unsteady and uncoordinated with his cane The pedicle of his confusion was   on 2/18 when he was sitting on the ward trend his wife got up to go to the car and open the car door.   patient said "I reckon that is what they do when you die". His wife went ahead and videotaped these episodes It is noted that patient also has been on Neurontin before and has had confusion with this but nothing like what was presenting itself and patient was recommended to discontinue Neurontin at this morning's office visit Patient has been producing some yellow sputum.  Patient received nebulization, oxygen therapy, Solu-Medrol and most of his confusion seemed to have cleared in the emergency room itself  Review of Systems:  A 14 point ROS was performed and is negative except as noted in the HPI   Past Medical History  Diagnosis Date  . Enlarged prostate   . Elevated cholesterol   . Back pain     History reviewed. No pertinent past surgical history. Social History:  History   Social History Narrative      Used to be a Geophysicist/field seismologist   Used to smoke until 2012   Used to drink heavily a long time ago   Prior drug use   Current wife is 2nd wife-married 2007   Finished high school-did some college       Allergies  Allergen Reactions  . Neurontin [Gabapentin] Other (See Comments)    hallucinations    Family History  Problem Relation Age of Onset  . CAD Father   . Cancer Mother     Prior to Admission medications   Medication Sig Start Date End Date Taking? Authorizing Provider  ALPRAZolam Duanne Moron) 1 MG tablet Take 1 mg by mouth at bedtime as needed. For sleep   Yes Historical Provider, MD  citalopram (CELEXA) 40 MG tablet Take 40 mg by mouth daily.   Yes Historical Provider, MD  fish oil-omega-3 fatty acids 1000 MG capsule Take 1 g by mouth daily.   Yes Historical Provider, MD  loratadine (CLARITIN) 10 MG tablet Take 10 mg by mouth daily.   Yes Historical Provider, MD  Multiple Vitamin (MULTIVITAMIN WITH MINERALS) TABS Take 1 tablet by mouth daily.   Yes Historical Provider, MD  oxyCODONE (OXYCONTIN) 80 MG 12 hr tablet Take 80 mg by mouth 3 (three) times daily.   Yes Historical Provider, MD   Physical Exam: Filed Vitals:   08/24/14 1206 08/24/14 1300  BP: 121/68  Pulse: 116   Temp: 99 F (37.2 C)   TempSrc: Oral   Resp: 22   Height: 5\' 4"  (1.626 m)   Weight: 90.719 kg (200 lb)   SpO2: 93% 96%    Alert pleasant oriented, no noted confusion at present-no respiratory distress EOMI NCAT No rales no rhonchi Abdomen soft nontender nondistended no rebound or guarding S1-S2 no murmur rub or gallop Edentulous Mallampati 4 No lower extremity edema Range of motion intact power 5/5 Left thigh has long surgical scar ~ 30 cm Straight leg raising test is within normal limits   Labs on Admission:  Basic Metabolic Panel:  Recent Labs Lab 08/24/14 1256  NA 136  K  4.1  CL 97  CO2 34*  GLUCOSE 122*  BUN 7  CREATININE 1.02  CALCIUM 8.7   Liver Function Tests: No results for input(s): AST, ALT, ALKPHOS, BILITOT, PROT, ALBUMIN in the last 168 hours. No results for input(s): LIPASE, AMYLASE in the last 168 hours. No results for input(s): AMMONIA in the last 168 hours. CBC:  Recent Labs Lab 08/24/14 1256  WBC 13.0*  NEUTROABS 9.4*  HGB 12.2*  HCT 37.8*  MCV 95.0  PLT 189   Cardiac Enzymes: No results for input(s): CKTOTAL, CKMB, CKMBINDEX, TROPONINI in the last 168 hours.  BNP (last 3 results)  Recent Labs  08/24/14 1256  BNP 55.0    ProBNP (last 3 results) No results for input(s): PROBNP in the last 8760 hours.  CBG: No results for input(s): GLUCAP in the last 168 hours.  Radiological Exams on Admission: Dg Chest Port 1 View  08/24/2014   CLINICAL DATA:  Cough for 1-1/2 weeks.  EXAM: PORTABLE CHEST - 1 VIEW  COMPARISON:  02/22/2013  FINDINGS: There is moderate cardiac enlargement. No pleural effusion identified. Chronic interstitial coarsening is identified bilaterally. No airspace consolidation identified.  IMPRESSION: 1. Cardiac enlargement. 2. Chronic interstitial coarsening.   Electronically Signed   By: Kerby Moors M.D.   On: 08/24/2014 13:45    EKG: Independently reviewed.  Sinus tachycardia PR interval 0.12 QRS axis 0, rate related changes across precordium borderline criteria for LVH  Assessment/Plan Principal Problem:   Respiratory failure, acute-probable acute hypercarbic respiratory failure--bicarbonate and basic metabolic panel is 34.  He has a history of chronic smoking and is on multiple centrally acting respiratory depressants such as OxyContin 80 3 times a day and Ativan.  We will need to carefully administer these while he is in the hospital to prevent withdrawal however we will take him off of his Gabapentin. He will need monitoring on telemetry unit and we will nebulize him with albuterol every 6 when  necessary for now.   Toxic metabolic encephalopathy-possibly iatrogenic/possibly secondary to CO2 narcosis--need blood gas.    COPD likely mild- we will start inhalers Albuterol q 6 neds.  Change Solu-medrol to PO prednisone 60 mg daily.  Dulera started this admit       Chronic pain syndrome-secondary to lumbar disc disease-he has had multiple injections in his back by physical medicine however his wife's insurance was dropped and he was not able to afford these subsequently. He will need holistic outpatient management   Metabolic syndrome X-Body mass index is 34.31 kg/(m^2).-needs outpatient re-assessment.  Consider aspirin 81 mg if no contraindication as per PCP   Sinus tachycardia-2/2 to Albuterol   Diverticulosis, h/o  50 minutes Admit to telemetry Full [place], [place] [facility] Pager [phone]  If 7PM-7AM, please contact night-coverage [email or web address]  Password [email or web address] 08/24/2014, 2:13 PM

## 2014-08-24 NOTE — ED Provider Notes (Signed)
CSN: [id number]     Arrival date & time 08/24/14  1151 History  This chart was scribed for Carmin Muskrat, MD by Edison Simon, ED Scribe. This patient was seen in room APA03/APA03 and the patient's care was started at 12:52 PM.    Chief Complaint  Patient presents with  . Shortness of Breath   The history is provided by the patient and the spouse. No language interpreter was used.    HPI Comments: Victor Fields is a 59 y.o. male who presents to the Emergency Department complaining of SOB and confusion, referred here from Dr. Nolon Rod office. Per wife, his symptoms began with cold, cough, and wheezing 1 week ago, and he developed confusion this week. She suspects his confusion is due to taking Neurontin. He complains of generalized weakness, congestion, and headache. He reports mild difficulty breathing. Wife also notes that he is retraining fluid diffusely and states he has gained 20 lb in the past month. She notes he has problems urinating due to enlarged prostate. She states he has FHx of cardiac disease but no personal history besides heart murmur.  Past Medical History  Diagnosis Date  . Enlarged prostate   . Elevated cholesterol   . Back pain    History reviewed. No pertinent past surgical history. Family History  Problem Relation Age of Onset  . CAD Father   . Cancer Mother    History  Substance Use Topics  . Smoking status: Former Smoker    Types: Cigarettes  . Smokeless tobacco: Not on file  . Alcohol Use: No    Review of Systems  Constitutional: Positive for unexpected weight change.       Per HPI, otherwise negative  HENT: Positive for congestion.        Per HPI, otherwise negative  Respiratory: Positive for cough, shortness of breath and wheezing.        Per HPI, otherwise negative  Cardiovascular:       Per HPI, otherwise negative  Gastrointestinal: Negative for vomiting.  Endocrine:       Negative aside from HPI  Genitourinary: Positive for difficulty  urinating.       Neg aside from HPI   Musculoskeletal:       Per HPI, otherwise negative  Skin: Negative.   Neurological: Positive for weakness and headaches. Negative for syncope.  Psychiatric/Behavioral: Positive for confusion.      Allergies  Neurontin  Home Medications   Prior to Admission medications   Medication Sig Start Date End Date Taking? Authorizing Provider  ALPRAZolam Duanne Moron) 1 MG tablet Take 1 mg by mouth at bedtime as needed. For sleep   Yes Historical Provider, MD  citalopram (CELEXA) 40 MG tablet Take 40 mg by mouth daily.   Yes Historical Provider, MD  fish oil-omega-3 fatty acids 1000 MG capsule Take 1 g by mouth daily.   Yes Historical Provider, MD  loratadine (CLARITIN) 10 MG tablet Take 10 mg by mouth daily.   Yes Historical Provider, MD  Multiple Vitamin (MULTIVITAMIN WITH MINERALS) TABS Take 1 tablet by mouth daily.   Yes Historical Provider, MD  oxyCODONE (OXYCONTIN) 80 MG 12 hr tablet Take 80 mg by mouth 3 (three) times daily.   Yes Historical Provider, MD   BP 112/84 mmHg  Pulse 99  Temp(Src) 99 F (37.2 C) (Oral)  Resp 11  Ht 5\' 4"  (1.626 m)  Wt 200 lb (90.719 kg)  BMI 34.31 kg/m2  SpO2 97% Physical Exam  Constitutional: He is  oriented to person, place, and time. He appears well-developed. No distress.  HENT:  Head: Normocephalic and atraumatic.  Eyes: Conjunctivae and EOM are normal.  Cardiovascular: Normal rate and regular rhythm.   Good bilateral upper arm pulses  Pulmonary/Chest: Effort normal. No stridor. No respiratory distress.  Wheezes bilaterally, noticeably.  Abdominal: Soft. He exhibits distension. There is no tenderness.  Musculoskeletal: He exhibits edema (trace pitting edema).  Neurological: He is alert and oriented to person, place, and time.  Slightly slow to answer questions, but face is symmetric, speech is clear, no gross asymmetry of strength.  Skin: Skin is warm and dry.  Psychiatric: He has a normal mood and affect. He  is slowed.  Nursing note and vitals reviewed.   ED Course  Procedures (including critical care time)  DIAGNOSTIC STUDIES: Oxygen Saturation is 93% on nasal canula, adequate by my interpretation.    COORDINATION OF CARE: 12:57 PM Discussed treatment plan with patient at beside, the patient agrees with the plan and has no further questions at this time.   Labs Review Labs Reviewed  CBC WITH DIFFERENTIAL/PLATELET - Abnormal; Notable for the following:    WBC 13.0 (*)    RBC 3.98 (*)    Hemoglobin 12.2 (*)    HCT 37.8 (*)    Neutro Abs 9.4 (*)    Monocytes Absolute 1.1 (*)    All other components within normal limits  BASIC METABOLIC PANEL - Abnormal; Notable for the following:    CO2 34 (*)    Glucose, Bld 122 (*)    GFR calc non Af Amer 79 (*)    All other components within normal limits  BLOOD GAS, ARTERIAL - Abnormal; Notable for the following:    pCO2 arterial 61.4 (*)    pO2, Arterial 73.1 (*)    Bicarbonate 33.1 (*)    Acid-Base Excess 7.5 (*)    All other components within normal limits  BRAIN NATRIURETIC PEPTIDE    Imaging Review Dg Chest Port 1 View  08/24/2014   CLINICAL DATA:  Cough for 1-1/2 weeks.  EXAM: PORTABLE CHEST - 1 VIEW  COMPARISON:  02/22/2013  FINDINGS: There is moderate cardiac enlargement. No pleural effusion identified. Chronic interstitial coarsening is identified bilaterally. No airspace consolidation identified.  IMPRESSION: 1. Cardiac enlargement. 2. Chronic interstitial coarsening.   Electronically Signed   By: Kerby Moors M.D.   On: 08/24/2014 13:45     EKG Interpretation   Date/Time:  Friday August 24 2014 12:49:23 EST Ventricular Rate:  101 PR Interval:  154 QRS Duration: 88 QT Interval:  343 QTC Calculation: 445 R Axis:   -151 Text Interpretation:  Sinus tachycardia Right axis deviation Borderline T  abnormalities, anterior leads Baseline wander in lead(s) III V3 Sinus  tachycardia Rightward axis T wave abnormality Abnormal  ekg Confirmed by  Carmin Muskrat  MD [phone]) on 08/24/2014 1:16:56 PM     Update: Patient improved with bronchodilators, but remains tachycardic, tachypneic, requiring oxygen. MDM   Final diagnoses:  SOB (shortness of breath)   patient with COPD presents from his 65 office with concerns of dyspnea, confusion.  Here the patient is awake and alert, oriented appropriately, but family does report isn't changes in cognition.  These seem unlikely due to stroke given his reassuring neurologic exam.  Patient's Coumadin does history concern for COPD exacerbation.  Patient presents substantially here respiratory wise.  With no new, dictation, but with concern for COPD exacerbation he was admitted for further evaluation and management.  I personally performed the services described in this documentation, which was scribed in my presence. The recorded information has been reviewed and is accurate.     Carmin Muskrat, MD 08/24/14 [phone]

## 2014-08-24 NOTE — ED Notes (Signed)
Dr Verlon Au paged at this time to relay critical value results

## 2014-08-24 NOTE — ED Notes (Signed)
Bladder scanner shows 709 mL.

## 2014-08-25 DIAGNOSIS — E8881 Metabolic syndrome: Secondary | ICD-10-CM

## 2014-08-25 DIAGNOSIS — G92 Toxic encephalopathy: Secondary | ICD-10-CM

## 2014-08-25 DIAGNOSIS — I471 Supraventricular tachycardia: Secondary | ICD-10-CM

## 2014-08-25 LAB — PROTIME-INR
INR: 1.1 (ref 0.00–1.49)
Prothrombin Time: 14.4 seconds (ref 11.6–15.2)

## 2014-08-25 LAB — CBC
HCT: 39 % (ref 39.0–52.0)
Hemoglobin: 12.5 g/dL — ABNORMAL LOW (ref 13.0–17.0)
MCH: 30 pg (ref 26.0–34.0)
MCHC: 32.1 g/dL (ref 30.0–36.0)
MCV: 93.8 fL (ref 78.0–100.0)
Platelets: 191 10*3/uL (ref 150–400)
RBC: 4.16 MIL/uL — AB (ref 4.22–5.81)
RDW: 13.1 % (ref 11.5–15.5)
WBC: 14 10*3/uL — ABNORMAL HIGH (ref 4.0–10.5)

## 2014-08-25 LAB — COMPREHENSIVE METABOLIC PANEL
ALT: 35 U/L (ref 0–53)
ANION GAP: 6 (ref 5–15)
AST: 36 U/L (ref 0–37)
Albumin: 3.1 g/dL — ABNORMAL LOW (ref 3.5–5.2)
Alkaline Phosphatase: 74 U/L (ref 39–117)
BUN: 10 mg/dL (ref 6–23)
CHLORIDE: 100 mmol/L (ref 96–112)
CO2: 32 mmol/L (ref 19–32)
CREATININE: 0.9 mg/dL (ref 0.50–1.35)
Calcium: 8.6 mg/dL (ref 8.4–10.5)
GFR calc Af Amer: >90 mL/min (ref >90)
Glucose, Bld: 161 mg/dL — ABNORMAL HIGH (ref 70–99)
POTASSIUM: 3.9 mmol/L (ref 3.5–5.1)
Sodium: 138 mmol/L (ref 135–145)
TOTAL PROTEIN: 6.8 g/dL (ref 6.0–8.3)
Total Bilirubin: 0.6 mg/dL (ref 0.3–1.2)

## 2014-08-25 MED ORDER — TAMSULOSIN HCL 0.4 MG PO CAPS
0.4000 mg | ORAL_CAPSULE | Freq: Every day | ORAL | Status: DC
  Administered 2014-08-25: 0.4 mg via ORAL
  Filled 2014-08-25: qty 1

## 2014-08-25 NOTE — Progress Notes (Signed)
Per telephone call from Dr. Verlon Au, patient can discontinue oxygen and heparin. Patient is to ambulate and were TEDs only at night.

## 2014-08-25 NOTE — Progress Notes (Signed)
Ambulated with patient around unit, cane used during ambulation. Oxygen saturations on room air was 94% ,while ambulating the unit on room air the lowest saturation reading was 93%.

## 2014-08-25 NOTE — Progress Notes (Signed)
Victor Fields:[id number] DOB: Nov 22, 1955 DOA: 08/24/2014 PCP: Glo Herring., MD  Brief narrative: 59 y/o ? Known history chronic pain secondary to lumbar degenerative disc disease/sacroiliac joint disorder status post injection currently on OxyContin 80 mg 3 times a day, prior history of pyelonephritis + prostatitis, hypertension, syncopal episode 2004, bipolar, Body mass index is 34.31 kg/(m^2).   Patient presented to his primary care physician office 2/18 with a one to two-week history of shortness of breath cough and congestion. Multiple family members have had similar symptoms and all of them have been treated with antibiotics. He is found on admission to have toxic metabolic encephalopathy secondary to hypercarbic respiratory failure with a PCO2 on ABG of 70-however his mentation had rapidly cleared after oxygen therapy, nebulization and he was admitted to regular floor   Past medical history-As per Problem list Chart reviewed as below- Reviewed  Consultants:  None  Procedures:  None  Antibiotics:  None   Subjective  Congested + +  cough without much sputum production Confusion has resolved Tolerating diet    Objective    Interim History:   Telemetry: Normal sinus rhythm   Objective: Filed Vitals:   08/24/14 2145 08/25/14 0537 08/25/14 0716 08/25/14 1332  BP: 129/82 138/83  134/94  Pulse: 96 94  110  Temp: 97.7 F (36.5 C) 97.6 F (36.4 C)  97.8 F (36.6 C)  TempSrc: Oral Oral  Oral  Resp: 20 18  18   Height:      Weight:      SpO2: 96% 92% 93% 94%    Intake/Output Summary (Last 24 hours) at 08/25/14 1347 Last data filed at 08/25/14 1332  Gross per 24 hour  Intake    480 ml  Output   1550 ml  Net  -1070 ml    Exam:  General: EOMI NCAT Cardiovascular: S1-S2 no murmur rub or gallop Respiratory: Clinically wheezy still bilaterally posteriorly no rales no rhonchi Abdomen: Obese nontender nondistended no rebound Skin no lower  extremity edema Neuro grossly intact completely oriented and cognizant  Data Reviewed: Basic Metabolic Panel:  Recent Labs Lab 08/24/14 1256 08/25/14 0623  NA 136 138  K 4.1 3.9  CL 97 100  CO2 34* 32  GLUCOSE 122* 161*  BUN 7 10  CREATININE 1.02 0.90  CALCIUM 8.7 8.6   Liver Function Tests:  Recent Labs Lab 08/25/14 0623  AST 36  ALT 35  ALKPHOS 74  BILITOT 0.6  PROT 6.8  ALBUMIN 3.1*   No results for input(s): LIPASE, AMYLASE in the last 168 hours. No results for input(s): AMMONIA in the last 168 hours. CBC:  Recent Labs Lab 08/24/14 1256 08/25/14 0623  WBC 13.0* 14.0*  NEUTROABS 9.4*  --   HGB 12.2* 12.5*  HCT 37.8* 39.0  MCV 95.0 93.8  PLT 189 191   Cardiac Enzymes: No results for input(s): CKTOTAL, CKMB, CKMBINDEX, TROPONINI in the last 168 hours. BNP: Invalid input(s): POCBNP CBG: No results for input(s): GLUCAP in the last 168 hours.  No results found for this or any previous visit (from the past 240 hour(s)).   Studies:              All Imaging reviewed and is as per above notation   Scheduled Meds: . antiseptic oral rinse  7 mL Mouth Rinse BID  . citalopram  40 mg Oral Daily  . heparin  5,000 Units Subcutaneous 3 times per day  . loratadine  10 mg Oral Daily  .  mometasone-formoterol  2 puff Inhalation BID  . multivitamin with minerals  1 tablet Oral Daily  . OxyCODONE  80 mg Oral TID  . predniSONE  60 mg Oral QAC breakfast  . sodium chloride  3 mL Intravenous Q12H  . tamsulosin  0.4 mg Oral QPC supper   Continuous Infusions:    Assessment/Plan: 1. Toxic metabolic encephalopathy secondary to hypercarbia-resolved and much improved 2. Acute hypercarbic respiratory failure-secondary to iatrogenic use of pain medications and Neurontin-this is happened before with Neurontin and this will be discontinued forever off of his med list. We will get a desaturation screen and ambulate the patient. If he is ambulatory he does not need Lovenox. If  he is able to get up without issue he may shower 3. COPD stage II-3 at least-continued to do 2 puffs twice a day inhaled, transition IV to by mouth steroids 2/19-prednisone 60 mg to complete on 08/29/14. 4. Chronic pain syndrome-difficult situation-his insurance will not allow him to get sacroiliac injections. He may benefit at a later time on an alternate adjunctive neuropathy medication such as Cymbalta versus amitriptyline-I will defer this to his primary care physician. He might need pain management as an outpatient with her pain physician-he will also need probable weight loss to offset the amount of weight he puts onto his left leg which has had surgery in the past 5. BPH-defer to primary care physician regarding PSA testing. I started empiric Flomax 0.4 daily at bedtime but hesitate to add finasteride or other medications at this stage given his altered mentation with Neurontin- 6. SCDs for DVT prophylaxis if able to walk  Code Status: Full Family Communication: 15 minute discussion with patient and family at bedside Disposition Plan: Inpatient   Verneita Griffes, MD  [facility] Pager [phone] 08/25/2014, 1:47 PM    LOS: 1 day

## 2014-08-26 MED ORDER — PREDNISONE 20 MG PO TABS: 60.0000 mg | ORAL_TABLET | Freq: Every day | ORAL | Status: DC

## 2014-08-26 MED ORDER — TAMSULOSIN HCL 0.4 MG PO CAPS: 0.4000 mg | ORAL_CAPSULE | Freq: Every day | ORAL | Status: DC

## 2014-08-26 MED ORDER — LEVOFLOXACIN 500 MG PO TABS: 500.0000 mg | ORAL_TABLET | Freq: Every day | ORAL | Status: DC

## 2014-08-26 MED ORDER — MOMETASONE FURO-FORMOTEROL FUM 100-5 MCG/ACT IN AERO: 2.0000 | INHALATION_SPRAY | Freq: Two times a day (BID) | RESPIRATORY_TRACT | Status: DC

## 2014-08-26 NOTE — Plan of Care (Signed)
Problem: Discharge Progression Outcomes Goal: Independent ADLs or Home Health Care Outcome: Completed/Met Date Met:  08/26/14 independent

## 2014-08-26 NOTE — Discharge Summary (Signed)
Physician Discharge Summary  FIORE DETJEN AJO:[id number] DOB: [birth year] DOA: 08/24/2014  PCP: Glo Herring., MD  Admit date: 08/24/2014 Discharge date: 08/26/2014  Time spent: 35 minutes  Recommendations for Outpatient Follow-up:  1. Patient recommended to follow-up with a pulmonologist to discuss need for PFTs 2. No control substances were prescribed on discharge-he will obtain this from his regular physician 3. Patient should not use Neurontin in the future-he may benefit however from trial of amitriptyline/nortriptyline/Cymbalta 4. Regular outpatient counseling for obesity 5. Consider PSA as an outpatient depending on current guidelines, family risk 6. Consider referral to pain management? Consider rereferral to PMR Dr. Jonni Sanger Kiersten's 7. Medications added  Prednisone 60 mg stop date = 2/25  Levaquin started on day of discharge-500 mg X 3 days  Flomax 0.4 mg daily for BPH symptoms   Discharge Diagnoses:  Principal Problem:   Respiratory failure, acute Active Problems:   Toxic metabolic encephalopathy-possibly iatrogenic/possibly secondary to COPD   Chronic pain syndrome-secondary to lumbar disc disease   Metabolic syndrome X   Sinus tachycardia   Diverticulosis, h/o   Acute respiratory acidosis   Discharge Condition: Fair  Diet recommendation: Heart healthy low-salt  Filed Weights   08/24/14 1206 08/24/14 1648  Weight: 90.719 kg (200 lb) 90.719 kg (200 lb)    History of present illness:  58 y/o ? Known history chronic pain secondary to lumbar degenerative disc disease/sacroiliac joint disorder status post injection currently on OxyContin 80 mg 3 times a day, prior history of pyelonephritis + prostatitis, hypertension, syncopal episode 2004, bipolar, Body mass index is 34.31 kg/(m^2).  Patient presented to his primary care physician office 2/18 with a one to two-week history of shortness of breath cough and congestion. Multiple family members have had similar  symptoms and all of them have been treated with antibiotics. He is found on admission to have toxic metabolic encephalopathy secondary to hypercarbic respiratory failure with a PCO2 on ABG of 70-however his mentation had rapidly cleared after oxygen therapy, nebulization and he was admitted to regular floor   1. Toxic metabolic encephalopathy secondary to hypercarbia-resolved and much improved--I suspect he had hypercarbic respiratory failure and this was worsened by being on both Ativan, chronic pain meds and Neurontin may been the culprit in this case. Outpatient discussion needed with primary physician regarding polypharmacy 2. Acute hypercarbic respiratory failure-secondary to iatrogenic use of pain medications and Neurontin-this is happened before with Neurontin and this will be discontinued forever off of his med list. The saturation screen did not imply the patient needed oxygen 3. COPD stage II-3 at least-continued to do 2 puffs twice a day inhaled, transition IV to by mouth steroids 2/19-prednisone 60 mg to complete on 08/30/14.  I have recommended the patient to discuss with his primary care physician potential outpatient PFTs as patient wants to think about 4. Chronic pain syndrome-difficult situation-his insurance will not allow him to get sacroiliac injections. He may benefit at a later time on an alternate adjunctive neuropathy medication such as Cymbalta versus amitriptyline-I will defer this to his primary care physician. He might need pain management as an outpatient with her pain physician-he will also need probable weight loss to offset the amount of weight he puts onto his left leg which has had surgery in the past 5. BPH-defer to primary care physician regarding PSA testing. I started empiric Flomax 0.4 daily at bedtime but hesitate to add finasteride or other medications at this stage given his altered mentation with Neurontin-  Discharge Exam: Filed Vitals:   08/26/14 0508   BP: 120/83  Pulse: 78  Temp: 97.8 F (36.6 C)  Resp: 18    General: Alert pleasant oriented no apparent distress Cardiovascular: S1-S2 no murmur rub or gallop Respiratory: Clinically clear no added sound  Discharge Instructions    Current Discharge Medication List    START taking these medications   Details  mometasone-formoterol (DULERA) 100-5 MCG/ACT AERO Inhale 2 puffs into the lungs 2 (two) times daily. Qty: 1 Inhaler, Refills: 0    predniSONE (DELTASONE) 20 MG tablet Take 3 tablets (60 mg total) by mouth daily before breakfast. Qty: 12 tablet, Refills: 0    tamsulosin (FLOMAX) 0.4 MG CAPS capsule Take 1 capsule (0.4 mg total) by mouth daily after supper. Qty: 30 capsule, Refills: 0      CONTINUE these medications which have NOT CHANGED   Details  ALPRAZolam (XANAX) 1 MG tablet Take 1 mg by mouth at bedtime as needed. For sleep    citalopram (CELEXA) 40 MG tablet Take 40 mg by mouth daily.    fish oil-omega-3 fatty acids 1000 MG capsule Take 1 g by mouth daily.    loratadine (CLARITIN) 10 MG tablet Take 10 mg by mouth daily.    Multiple Vitamin (MULTIVITAMIN WITH MINERALS) TABS Take 1 tablet by mouth daily.    oxyCODONE (OXYCONTIN) 80 MG 12 hr tablet Take 80 mg by mouth 3 (three) times daily.       Allergies  Allergen Reactions  . Neurontin [Gabapentin] Other (See Comments)    hallucinations      The results of significant diagnostics from this hospitalization (including imaging, microbiology, ancillary and laboratory) are listed below for reference.    Significant Diagnostic Studies: Dg Chest Port 1 View  08/24/2014   CLINICAL DATA:  Cough for 1-1/2 weeks.  EXAM: PORTABLE CHEST - 1 VIEW  COMPARISON:  02/22/2013  FINDINGS: There is moderate cardiac enlargement. No pleural effusion identified. Chronic interstitial coarsening is identified bilaterally. No airspace consolidation identified.  IMPRESSION: 1. Cardiac enlargement. 2. Chronic interstitial  coarsening.   Electronically Signed   By: Kerby Moors M.D.   On: 08/24/2014 13:45    Microbiology: No results found for this or any previous visit (from the past 240 hour(s)).   Labs: Basic Metabolic Panel:  Recent Labs Lab 08/24/14 1256 08/25/14 0623  NA 136 138  K 4.1 3.9  CL 97 100  CO2 34* 32  GLUCOSE 122* 161*  BUN 7 10  CREATININE 1.02 0.90  CALCIUM 8.7 8.6   Liver Function Tests:  Recent Labs Lab 08/25/14 0623  AST 36  ALT 35  ALKPHOS 74  BILITOT 0.6  PROT 6.8  ALBUMIN 3.1*   No results for input(s): LIPASE, AMYLASE in the last 168 hours. No results for input(s): AMMONIA in the last 168 hours. CBC:  Recent Labs Lab 08/24/14 1256 08/25/14 0623  WBC 13.0* 14.0*  NEUTROABS 9.4*  --   HGB 12.2* 12.5*  HCT 37.8* 39.0  MCV 95.0 93.8  PLT 189 191   Cardiac Enzymes: No results for input(s): CKTOTAL, CKMB, CKMBINDEX, TROPONINI in the last 168 hours. BNP: BNP (last 3 results)  Recent Labs  08/24/14 1256  BNP 55.0    ProBNP (last 3 results) No results for input(s): PROBNP in the last 8760 hours.  CBG: No results for input(s): GLUCAP in the last 168 hours.     SignedNita Sells  [facility] 08/26/2014, 12:01 PM

## 2014-08-27 NOTE — Progress Notes (Signed)
UR chart review completed.  

## 2014-09-06 DIAGNOSIS — G894 Chronic pain syndrome: Secondary | ICD-10-CM | POA: Diagnosis not present

## 2014-09-06 DIAGNOSIS — J449 Chronic obstructive pulmonary disease, unspecified: Secondary | ICD-10-CM | POA: Diagnosis not present

## 2014-09-06 DIAGNOSIS — G4700 Insomnia, unspecified: Secondary | ICD-10-CM | POA: Diagnosis not present

## 2014-09-06 DIAGNOSIS — Z6830 Body mass index (BMI) 30.0-30.9, adult: Secondary | ICD-10-CM | POA: Diagnosis not present

## 2014-10-16 DIAGNOSIS — M1991 Primary osteoarthritis, unspecified site: Secondary | ICD-10-CM | POA: Diagnosis not present

## 2014-10-16 DIAGNOSIS — G894 Chronic pain syndrome: Secondary | ICD-10-CM | POA: Diagnosis not present

## 2014-10-16 DIAGNOSIS — Z6832 Body mass index (BMI) 32.0-32.9, adult: Secondary | ICD-10-CM | POA: Diagnosis not present

## 2014-10-16 DIAGNOSIS — N400 Enlarged prostate without lower urinary tract symptoms: Secondary | ICD-10-CM | POA: Diagnosis not present

## 2014-12-10 DIAGNOSIS — M25552 Pain in left hip: Secondary | ICD-10-CM | POA: Diagnosis not present

## 2014-12-17 DIAGNOSIS — M1991 Primary osteoarthritis, unspecified site: Secondary | ICD-10-CM | POA: Diagnosis not present

## 2014-12-17 DIAGNOSIS — Z6833 Body mass index (BMI) 33.0-33.9, adult: Secondary | ICD-10-CM | POA: Diagnosis not present

## 2014-12-17 DIAGNOSIS — E6609 Other obesity due to excess calories: Secondary | ICD-10-CM | POA: Diagnosis not present

## 2014-12-17 DIAGNOSIS — G894 Chronic pain syndrome: Secondary | ICD-10-CM | POA: Diagnosis not present

## 2015-03-19 DIAGNOSIS — G894 Chronic pain syndrome: Secondary | ICD-10-CM | POA: Diagnosis not present

## 2015-03-19 DIAGNOSIS — Z6832 Body mass index (BMI) 32.0-32.9, adult: Secondary | ICD-10-CM | POA: Diagnosis not present

## 2015-03-19 DIAGNOSIS — F419 Anxiety disorder, unspecified: Secondary | ICD-10-CM | POA: Diagnosis not present

## 2015-03-19 DIAGNOSIS — M1991 Primary osteoarthritis, unspecified site: Secondary | ICD-10-CM | POA: Diagnosis not present

## 2015-07-18 DIAGNOSIS — G8929 Other chronic pain: Secondary | ICD-10-CM | POA: Diagnosis not present

## 2015-07-18 DIAGNOSIS — Z79899 Other long term (current) drug therapy: Secondary | ICD-10-CM | POA: Diagnosis not present

## 2015-07-18 DIAGNOSIS — M25552 Pain in left hip: Secondary | ICD-10-CM | POA: Diagnosis not present

## 2015-07-18 DIAGNOSIS — M1652 Unilateral post-traumatic osteoarthritis, left hip: Secondary | ICD-10-CM | POA: Diagnosis not present

## 2015-07-18 DIAGNOSIS — Z87891 Personal history of nicotine dependence: Secondary | ICD-10-CM | POA: Diagnosis not present

## 2015-07-18 DIAGNOSIS — G894 Chronic pain syndrome: Secondary | ICD-10-CM | POA: Diagnosis not present

## 2015-07-18 DIAGNOSIS — S7292XS Unspecified fracture of left femur, sequela: Secondary | ICD-10-CM | POA: Diagnosis not present

## 2015-07-18 DIAGNOSIS — M1991 Primary osteoarthritis, unspecified site: Secondary | ICD-10-CM | POA: Diagnosis not present

## 2015-07-18 DIAGNOSIS — M21952 Unspecified acquired deformity of left thigh: Secondary | ICD-10-CM | POA: Diagnosis not present

## 2015-07-18 DIAGNOSIS — M21852 Other specified acquired deformities of left thigh: Secondary | ICD-10-CM | POA: Diagnosis not present

## 2015-08-15 DIAGNOSIS — J449 Chronic obstructive pulmonary disease, unspecified: Secondary | ICD-10-CM | POA: Diagnosis not present

## 2015-08-15 DIAGNOSIS — E781 Pure hyperglyceridemia: Secondary | ICD-10-CM | POA: Diagnosis not present

## 2015-08-15 DIAGNOSIS — Z0001 Encounter for general adult medical examination with abnormal findings: Secondary | ICD-10-CM | POA: Diagnosis not present

## 2015-08-15 DIAGNOSIS — Z79899 Other long term (current) drug therapy: Secondary | ICD-10-CM | POA: Diagnosis not present

## 2015-08-15 DIAGNOSIS — N400 Enlarged prostate without lower urinary tract symptoms: Secondary | ICD-10-CM | POA: Diagnosis not present

## 2015-08-15 DIAGNOSIS — E785 Hyperlipidemia, unspecified: Secondary | ICD-10-CM | POA: Diagnosis not present

## 2015-09-23 DIAGNOSIS — R160 Hepatomegaly, not elsewhere classified: Secondary | ICD-10-CM | POA: Diagnosis not present

## 2015-09-23 DIAGNOSIS — M1992 Post-traumatic osteoarthritis, unspecified site: Secondary | ICD-10-CM | POA: Diagnosis not present

## 2015-09-23 DIAGNOSIS — Z87891 Personal history of nicotine dependence: Secondary | ICD-10-CM | POA: Diagnosis not present

## 2015-09-23 DIAGNOSIS — D696 Thrombocytopenia, unspecified: Secondary | ICD-10-CM | POA: Diagnosis not present

## 2015-10-02 DIAGNOSIS — R160 Hepatomegaly, not elsewhere classified: Secondary | ICD-10-CM | POA: Diagnosis not present

## 2015-10-02 DIAGNOSIS — N200 Calculus of kidney: Secondary | ICD-10-CM | POA: Diagnosis not present

## 2015-10-02 DIAGNOSIS — K838 Other specified diseases of biliary tract: Secondary | ICD-10-CM | POA: Diagnosis not present

## 2015-10-02 DIAGNOSIS — D696 Thrombocytopenia, unspecified: Secondary | ICD-10-CM | POA: Diagnosis not present

## 2015-10-07 DIAGNOSIS — D696 Thrombocytopenia, unspecified: Secondary | ICD-10-CM | POA: Diagnosis not present

## 2015-10-07 DIAGNOSIS — K7460 Unspecified cirrhosis of liver: Secondary | ICD-10-CM | POA: Diagnosis not present

## 2015-10-07 DIAGNOSIS — R160 Hepatomegaly, not elsewhere classified: Secondary | ICD-10-CM | POA: Diagnosis not present

## 2015-10-29 DIAGNOSIS — J449 Chronic obstructive pulmonary disease, unspecified: Secondary | ICD-10-CM | POA: Diagnosis not present

## 2015-10-29 DIAGNOSIS — G894 Chronic pain syndrome: Secondary | ICD-10-CM | POA: Diagnosis not present

## 2015-10-29 DIAGNOSIS — Z1389 Encounter for screening for other disorder: Secondary | ICD-10-CM | POA: Diagnosis not present

## 2015-11-25 DIAGNOSIS — J449 Chronic obstructive pulmonary disease, unspecified: Secondary | ICD-10-CM | POA: Diagnosis not present

## 2015-11-25 DIAGNOSIS — M1991 Primary osteoarthritis, unspecified site: Secondary | ICD-10-CM | POA: Diagnosis not present

## 2015-11-25 DIAGNOSIS — Z1389 Encounter for screening for other disorder: Secondary | ICD-10-CM | POA: Diagnosis not present

## 2015-11-28 DIAGNOSIS — R160 Hepatomegaly, not elsewhere classified: Secondary | ICD-10-CM | POA: Diagnosis not present

## 2015-12-23 DIAGNOSIS — R4182 Altered mental status, unspecified: Secondary | ICD-10-CM | POA: Diagnosis not present

## 2015-12-23 DIAGNOSIS — R410 Disorientation, unspecified: Secondary | ICD-10-CM | POA: Diagnosis not present

## 2015-12-23 DIAGNOSIS — R413 Other amnesia: Secondary | ICD-10-CM | POA: Diagnosis not present

## 2015-12-30 DIAGNOSIS — R160 Hepatomegaly, not elsewhere classified: Secondary | ICD-10-CM | POA: Diagnosis not present

## 2016-01-14 IMAGING — CT CT PELVIS W/ CM
2 of 4 series · 17 of 46 positions shown, 19 images · IV contrast (omnipaque)
Comparison: None.

CLINICAL DATA: Left lower quadrant pain for 1 month.

EXAM:
CT PELVIS WITH CONTRAST
TECHNIQUE: Multidetector CT imaging of the pelvis was performed using the
standard protocol following the bolus administration of intravenous
contrast.
CONTRAST:  100 mL OMNIPAQUE IOHEXOL 300 MG/ML  SOLN

[Series 2: pelvis 5.0 b40f · axial · 0.69mm/px · z∈[+358,+648]mm · 14 of 68 slices shown, 16 images]
[im 5/68  soft-tissue]
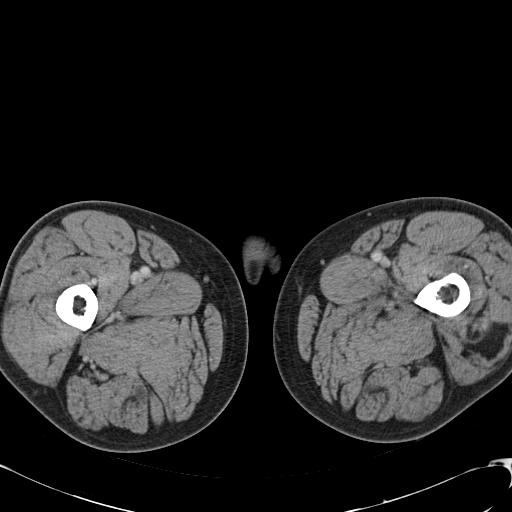
[im 5/68  bone]
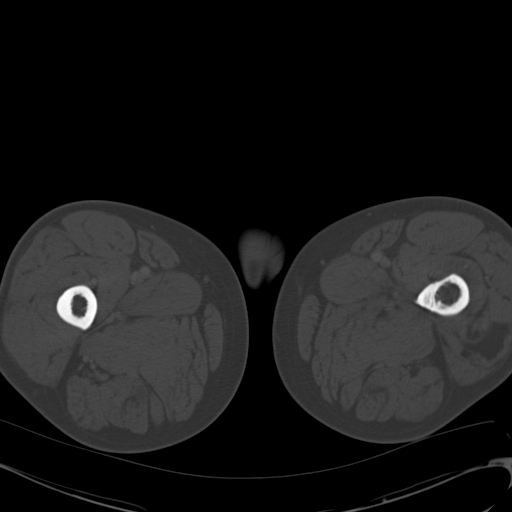
[im 9/68  soft-tissue]
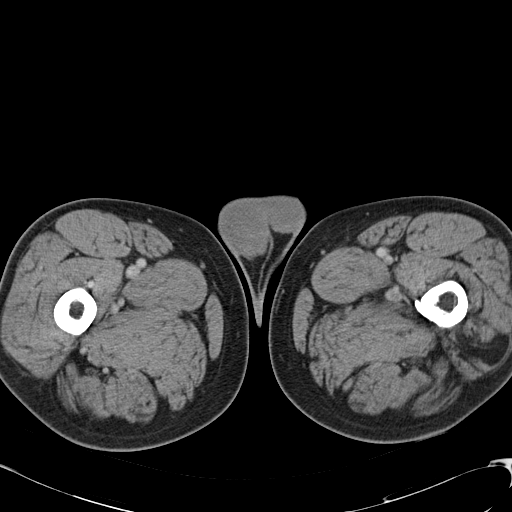
[im 14/68  soft-tissue]
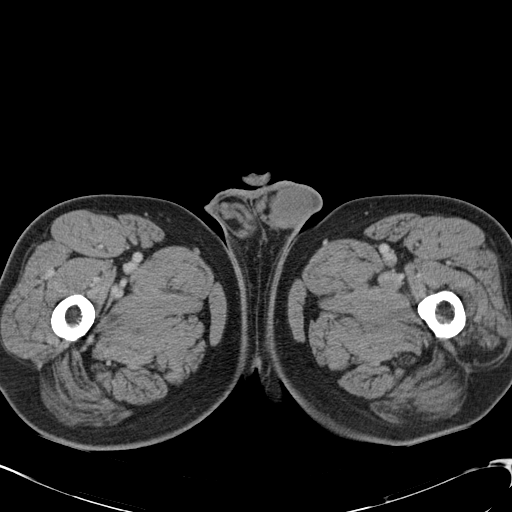
[im 18/68  soft-tissue]
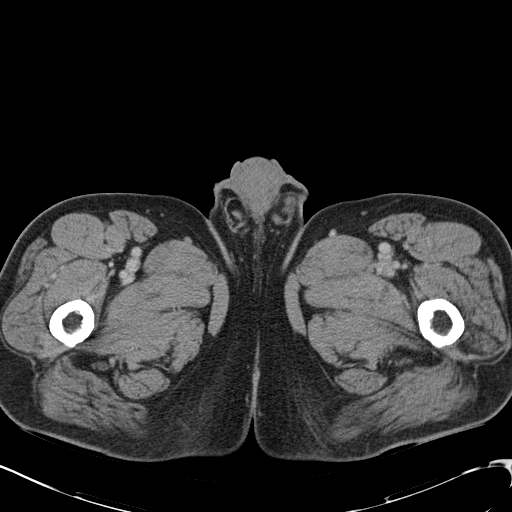
[im 23/68  soft-tissue]
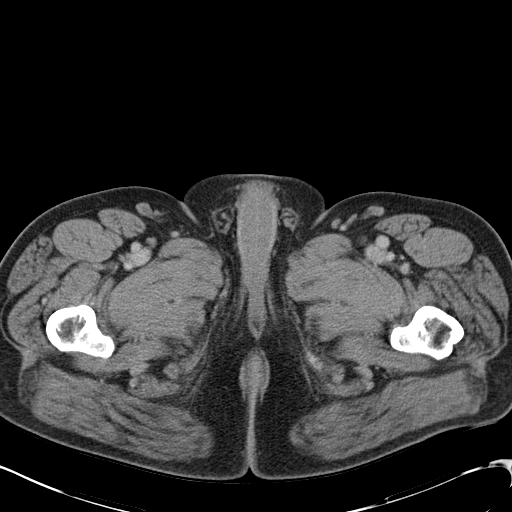
[im 27/68  soft-tissue]
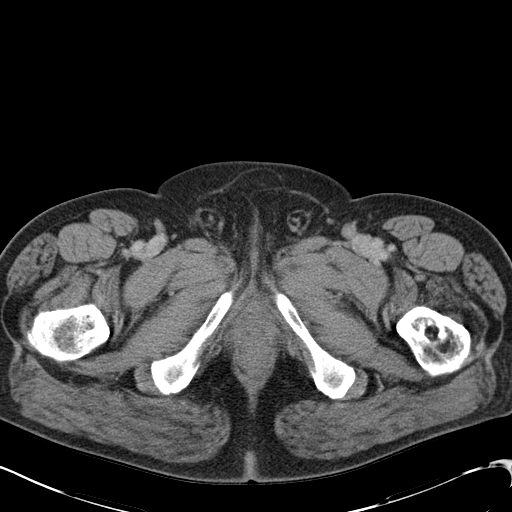
[im 32/68  soft-tissue]
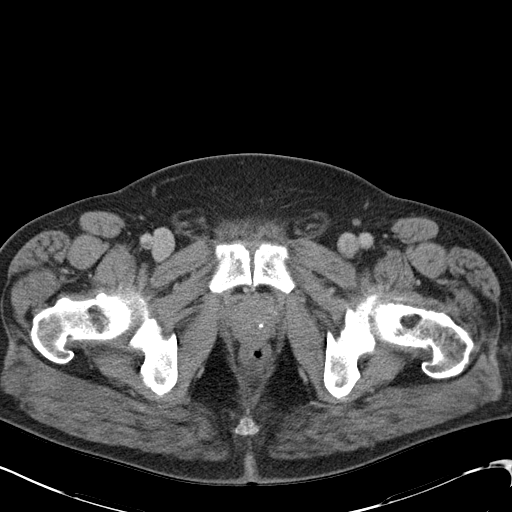
[im 36/68  soft-tissue]
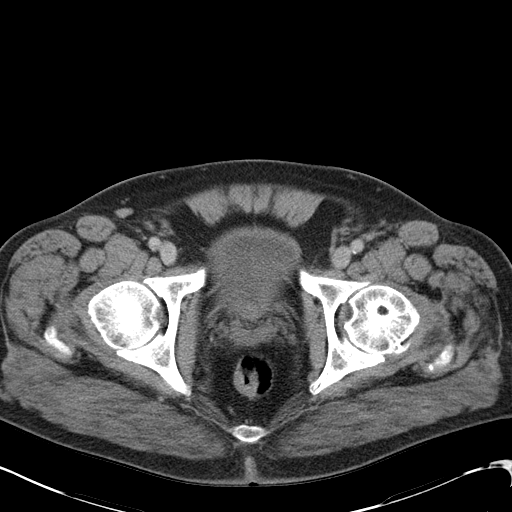
[im 41/68  soft-tissue]
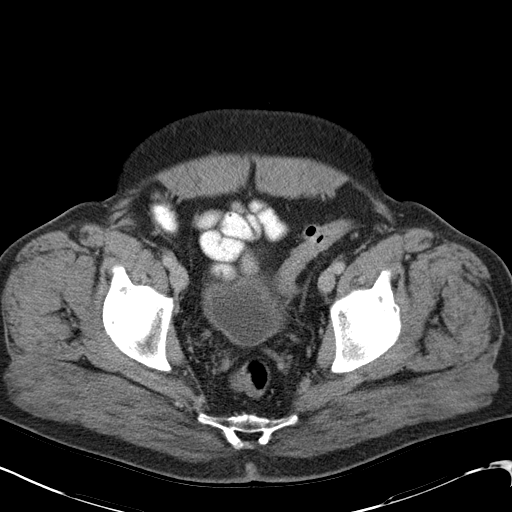
[im 41/68  bone]
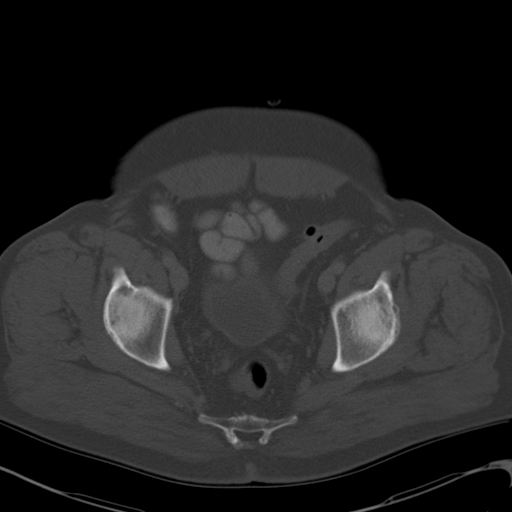
[im 45/68  soft-tissue]
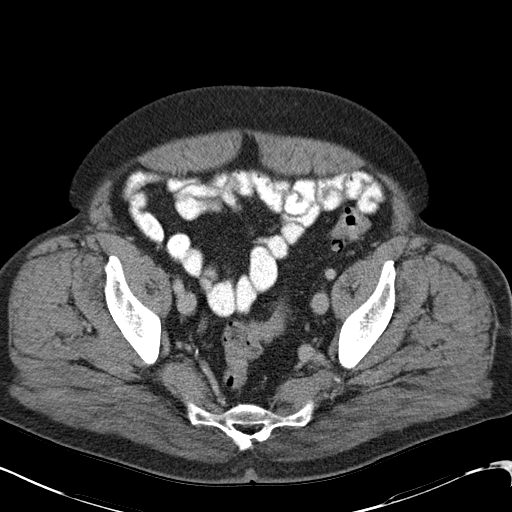
[im 50/68  soft-tissue]
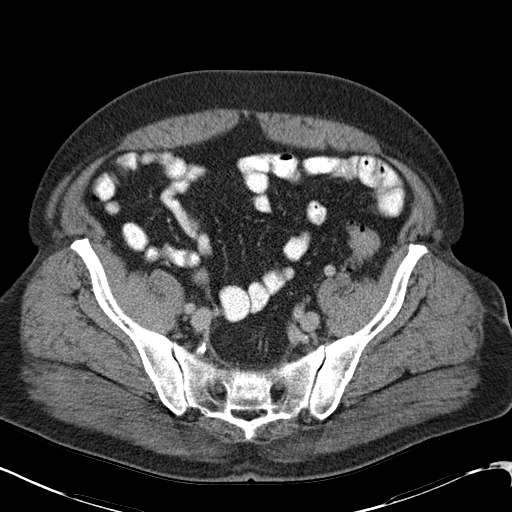
[im 54/68  soft-tissue]
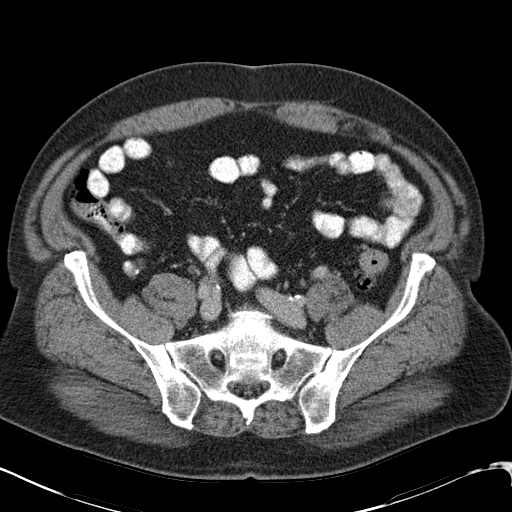
[im 59/68  soft-tissue]
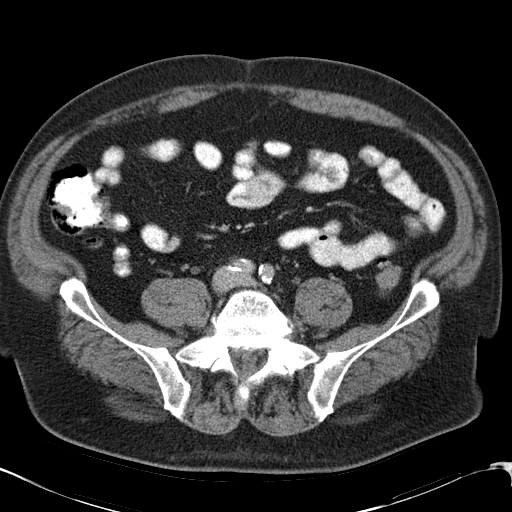
[im 63/68  soft-tissue]
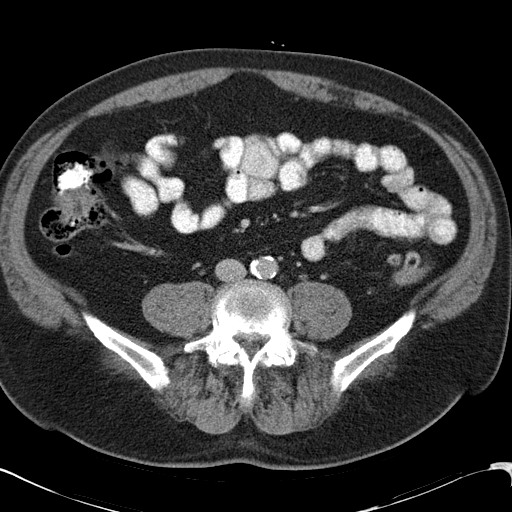

[Series 3: mpr coronal a/p cor · coronal · 0.69mm/px · 3 of 103 slices shown]
[im 35/103  soft-tissue]
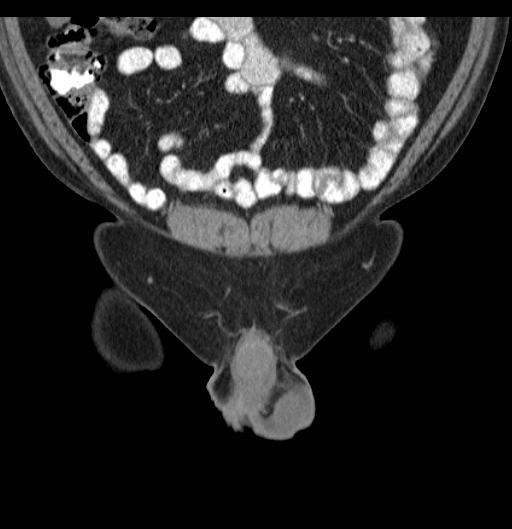
[im 46/103  soft-tissue]
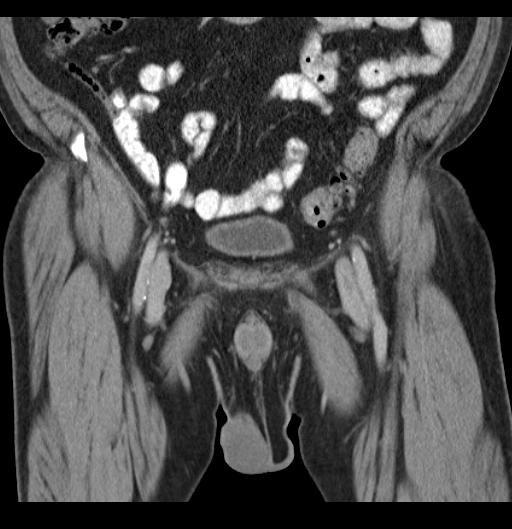
[im 57/103  soft-tissue]
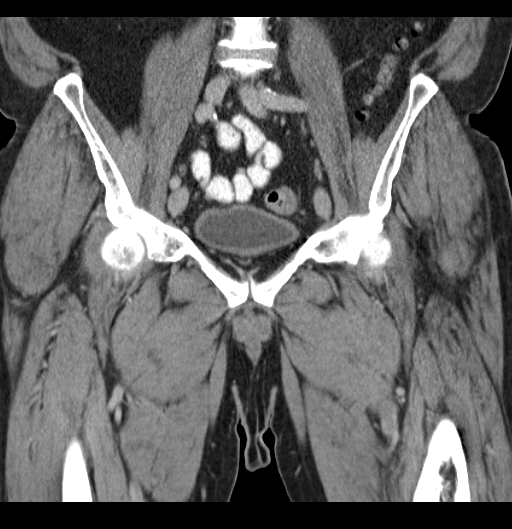

[17 of 46 positions shown; findings below may reference images not displayed]

FINDINGS: Diverticular disease of the descending and sigmoid colon is
identified without evidence of acute diverticulitis. Visualized
ascending and transverse colon appear normal. The appendix and all
imaged small bowel loops also appear normal. There is no
lymphadenopathy. No fluid collection is identified. Aortoiliac
atherosclerosis without aneurysm is noted.

There is no lytic or sclerotic bony lesion. Tract from prior screw
placement in the left hip is noted. There is degenerative change
about the hips, worse on the left. Disc bulging L4-5 and L5-S1 is
also identified.
IMPRESSION: No acute finding or abnormality to explain patient's symptoms.

Diverticulosis without diverticulitis.

Postoperative change left hip.

## 2016-02-06 DIAGNOSIS — Z1389 Encounter for screening for other disorder: Secondary | ICD-10-CM | POA: Diagnosis not present

## 2016-02-06 DIAGNOSIS — A4902 Methicillin resistant Staphylococcus aureus infection, unspecified site: Secondary | ICD-10-CM | POA: Diagnosis not present

## 2016-02-06 DIAGNOSIS — L0292 Furuncle, unspecified: Secondary | ICD-10-CM | POA: Diagnosis not present

## 2016-02-19 DIAGNOSIS — G894 Chronic pain syndrome: Secondary | ICD-10-CM | POA: Diagnosis not present

## 2016-04-09 IMAGING — CR DG CHEST 1V PORT
1 series · 1 of 1 positions shown · non-contrast
Comparison: 02/22/2013

CLINICAL DATA: Cough for 1-1/2 weeks.

EXAM:
PORTABLE CHEST - 1 VIEW

[ap portable]
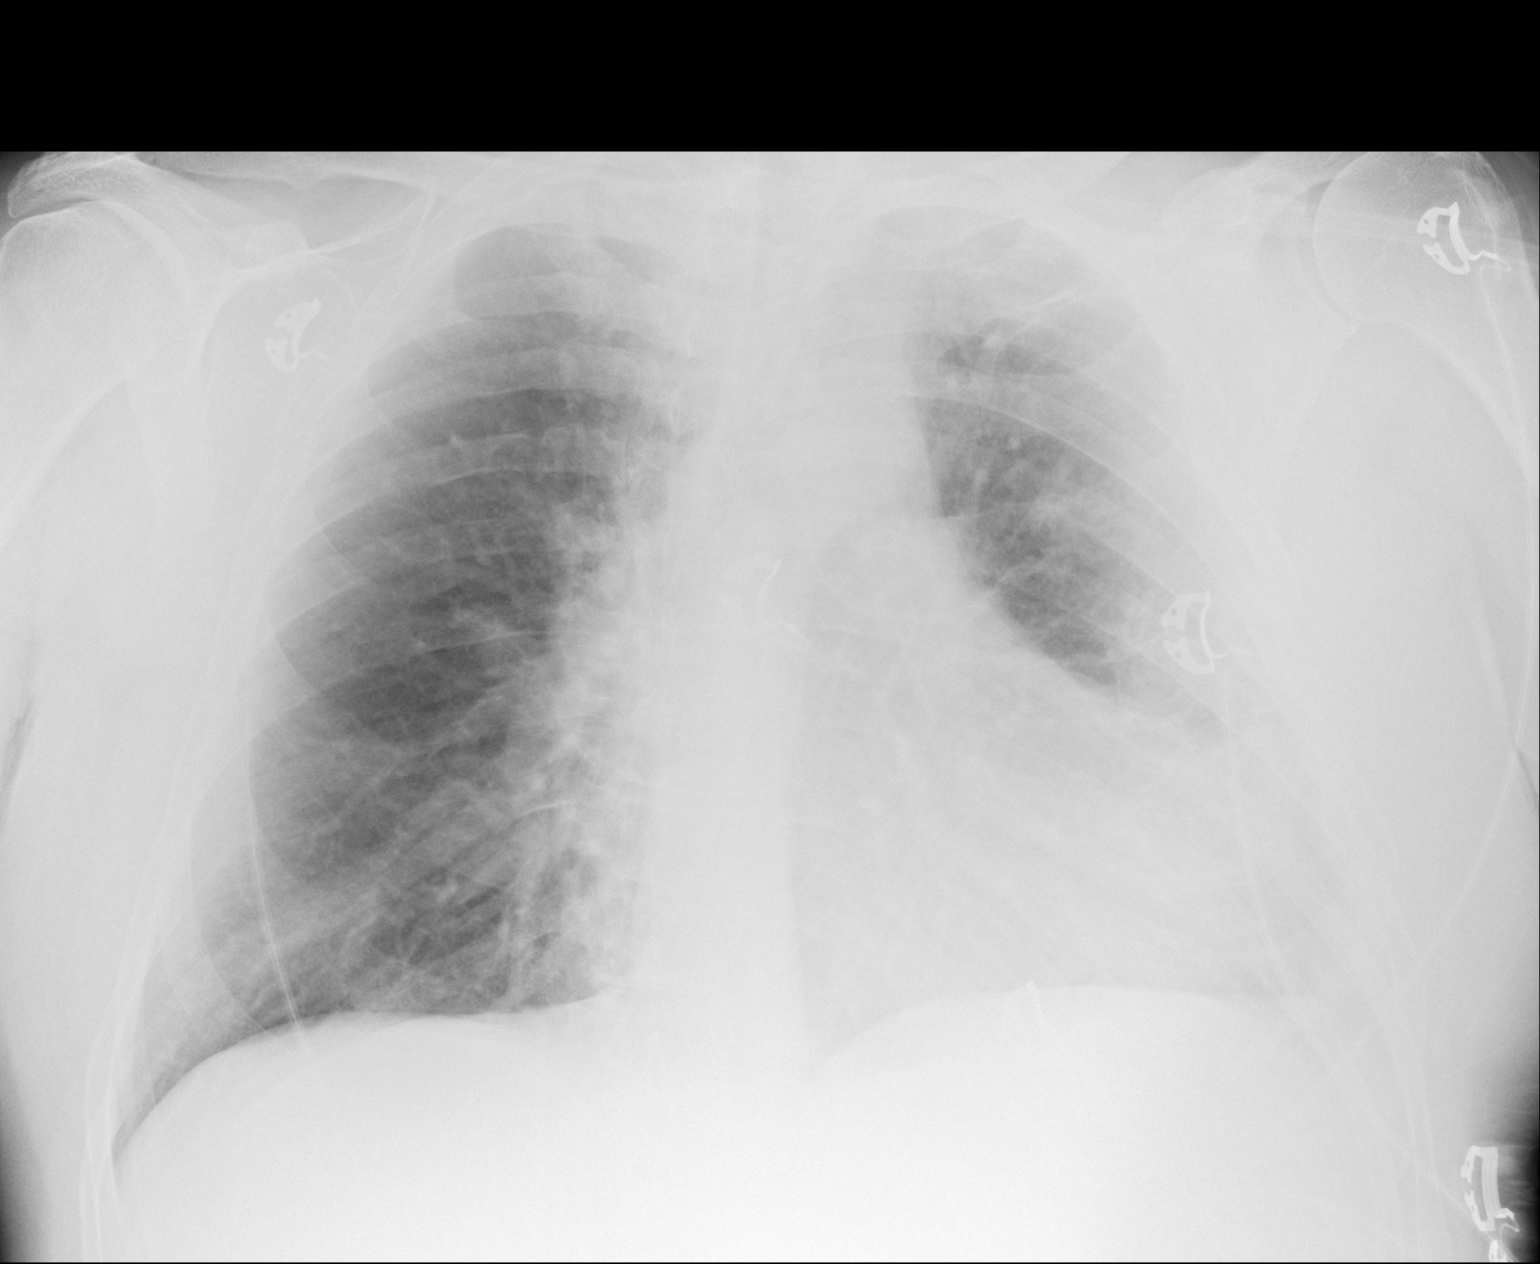

[1 of 1 positions shown; findings below may reference images not displayed]

FINDINGS: There is moderate cardiac enlargement. No pleural effusion
identified. Chronic interstitial coarsening is identified
bilaterally. No airspace consolidation identified.
IMPRESSION: 1. Cardiac enlargement.
2. Chronic interstitial coarsening.

## 2016-08-07 DIAGNOSIS — G894 Chronic pain syndrome: Secondary | ICD-10-CM | POA: Diagnosis not present

## 2016-08-07 DIAGNOSIS — Z23 Encounter for immunization: Secondary | ICD-10-CM | POA: Diagnosis not present

## 2016-08-07 DIAGNOSIS — M1991 Primary osteoarthritis, unspecified site: Secondary | ICD-10-CM | POA: Diagnosis not present

## 2016-10-19 ENCOUNTER — Encounter (HOSPITAL_COMMUNITY): Payer: Self-pay | Admitting: Emergency Medicine

## 2016-10-19 ENCOUNTER — Emergency Department (HOSPITAL_COMMUNITY)
Admission: EM | Admit: 2016-10-19 | Discharge: 2016-10-19 | Disposition: A | Discharge status: Home / Self Care | Payer: Medicare Other | Attending: Emergency Medicine | Admitting: Emergency Medicine

## 2016-10-19 DIAGNOSIS — R110 Nausea: Secondary | ICD-10-CM | POA: Insufficient documentation

## 2016-10-19 DIAGNOSIS — R339 Retention of urine, unspecified: Secondary | ICD-10-CM | POA: Insufficient documentation

## 2016-10-19 DIAGNOSIS — Z79899 Other long term (current) drug therapy: Secondary | ICD-10-CM | POA: Diagnosis not present

## 2016-10-19 DIAGNOSIS — I10 Essential (primary) hypertension: Secondary | ICD-10-CM | POA: Insufficient documentation

## 2016-10-19 DIAGNOSIS — R102 Pelvic and perineal pain: Secondary | ICD-10-CM | POA: Diagnosis not present

## 2016-10-19 DIAGNOSIS — R140 Abdominal distension (gaseous): Secondary | ICD-10-CM | POA: Insufficient documentation

## 2016-10-19 DIAGNOSIS — Z87891 Personal history of nicotine dependence: Secondary | ICD-10-CM | POA: Insufficient documentation

## 2016-10-19 DIAGNOSIS — R079 Chest pain, unspecified: Secondary | ICD-10-CM | POA: Insufficient documentation

## 2016-10-19 HISTORY — DX: Essential (primary) hypertension: I10

## 2016-10-19 LAB — URINALYSIS, ROUTINE W REFLEX MICROSCOPIC
BILIRUBIN URINE: NEGATIVE
Bacteria, UA: NONE SEEN
GLUCOSE, UA: NEGATIVE mg/dL
Ketones, ur: NEGATIVE mg/dL
Leukocytes, UA: NEGATIVE
NITRITE: NEGATIVE
PH: 7 (ref 5.0–8.0)
Protein, ur: NEGATIVE mg/dL
Specific Gravity, Urine: 1.004 — ABNORMAL LOW (ref 1.005–1.030)
Squamous Epithelial / LPF: NONE SEEN

## 2016-10-19 LAB — COMPREHENSIVE METABOLIC PANEL
ALT: 23 U/L (ref 17–63)
AST: 39 U/L (ref 15–41)
Albumin: 3.6 g/dL (ref 3.5–5.0)
Alkaline Phosphatase: 100 U/L (ref 38–126)
Anion gap: 9 (ref 5–15)
BUN: 6 mg/dL (ref 6–20)
CALCIUM: 9.1 mg/dL (ref 8.9–10.3)
CHLORIDE: 98 mmol/L — AB (ref 101–111)
CO2: 30 mmol/L (ref 22–32)
Creatinine, Ser: 0.99 mg/dL (ref 0.61–1.24)
Glucose, Bld: 171 mg/dL — ABNORMAL HIGH (ref 65–99)
Potassium: 3.6 mmol/L (ref 3.5–5.1)
Sodium: 137 mmol/L (ref 135–145)
Total Bilirubin: 0.6 mg/dL (ref 0.3–1.2)
Total Protein: 7.8 g/dL (ref 6.5–8.1)

## 2016-10-19 LAB — CBC WITH DIFFERENTIAL/PLATELET
Basophils Absolute: 0 10*3/uL (ref 0.0–0.1)
Basophils Relative: 0 %
Eosinophils Absolute: 0.3 10*3/uL (ref 0.0–0.7)
Eosinophils Relative: 3 %
HCT: 42 % (ref 39.0–52.0)
Hemoglobin: 14.3 g/dL (ref 13.0–17.0)
LYMPHS ABS: 1.8 10*3/uL (ref 0.7–4.0)
LYMPHS PCT: 22 %
MCH: 30.7 pg (ref 26.0–34.0)
MCHC: 34 g/dL (ref 30.0–36.0)
MCV: 90.1 fL (ref 78.0–100.0)
MONO ABS: 0.5 10*3/uL (ref 0.1–1.0)
Monocytes Relative: 6 %
Neutro Abs: 5.6 10*3/uL (ref 1.7–7.7)
Neutrophils Relative %: 69 %
Platelets: 124 10*3/uL — ABNORMAL LOW (ref 150–400)
RBC: 4.66 MIL/uL (ref 4.22–5.81)
RDW: 13.6 % (ref 11.5–15.5)
WBC: 8.1 10*3/uL (ref 4.0–10.5)

## 2016-10-19 LAB — LIPASE, BLOOD: Lipase: 17 U/L (ref 11–51)

## 2016-10-19 MED ORDER — TAMSULOSIN HCL 0.4 MG PO CAPS: 0.4000 mg | ORAL_CAPSULE | Freq: Every day | ORAL | 0 refills | Status: DC

## 2016-10-19 NOTE — ED Notes (Signed)
Bladder scan revealed 150-160 cc.  Pt reports enlarged prostate.

## 2016-10-19 NOTE — ED Provider Notes (Signed)
[place] [facility] DEPT Provider Note   CSN: [id number] Arrival date & time: 10/19/16  9030     History   Chief Complaint Chief Complaint  Patient presents with  . Urinary Retention    HPI Victor Fields is a 61 y.o. male.  HPI Patient presents with urinary retention starting yesterday. Has had some hesitancy prior to retention. Denies any hematuria. Denies fever or chills. Has had lower abdominal discomfort and swelling. States he had mild nausea earlier but denies any currently. Has had history of previous urinary retention due to enlarged prostate. States he does not currently take Flomax. Past Medical History:  Diagnosis Date  . Back pain   . Elevated cholesterol   . Enlarged prostate   . Hypertension     Patient Active Problem List   Diagnosis Date Noted  . Respiratory failure, acute ([place]) 08/24/2014  . Toxic metabolic encephalopathy-possibly iatrogenic/possibly secondary to COPD 08/24/2014  . Chronic pain syndrome-secondary to lumbar disc disease 08/24/2014  . Metabolic syndrome X 03/28/3006  . Sinus tachycardia 08/24/2014  . Diverticulosis, h/o 08/24/2014  . Acute respiratory acidosis 08/24/2014    Past Surgical History:  Procedure Laterality Date  . crush injuries    . LUNG SURGERY         Home Medications    Prior to Admission medications   Medication Sig Start Date End Date Taking? Authorizing Provider  ALPRAZolam Duanne Moron) 1 MG tablet Take 1 mg by mouth at bedtime as needed. For sleep    Historical Provider, MD  citalopram (CELEXA) 40 MG tablet Take 40 mg by mouth daily.    Historical Provider, MD  fish oil-omega-3 fatty acids 1000 MG capsule Take 1 g by mouth daily.    Historical Provider, MD  levofloxacin (LEVAQUIN) 500 MG tablet Take 1 tablet (500 mg total) by mouth daily. 08/26/14   Nita Sells, MD  loratadine (CLARITIN) 10 MG tablet Take 10 mg by mouth daily.    Historical Provider, MD  mometasone-formoterol (DULERA) 100-5 MCG/ACT AERO Inhale  2 puffs into the lungs 2 (two) times daily. 08/26/14   Nita Sells, MD  Multiple Vitamin (MULTIVITAMIN WITH MINERALS) TABS Take 1 tablet by mouth daily.    Historical Provider, MD  oxyCODONE (OXYCONTIN) 80 MG 12 hr tablet Take 80 mg by mouth 3 (three) times daily.    Historical Provider, MD  predniSONE (DELTASONE) 20 MG tablet Take 3 tablets (60 mg total) by mouth daily before breakfast. 08/26/14   Nita Sells, MD  tamsulosin (FLOMAX) 0.4 MG CAPS capsule Take 1 capsule (0.4 mg total) by mouth daily after supper. 10/19/16   Julianne Rice, MD    Family History Family History  Problem Relation Age of Onset  . CAD Father   . Cancer Mother     Social History Social History  Substance Use Topics  . Smoking status: Former Smoker    Types: Cigarettes  . Smokeless tobacco: Never Used  . Alcohol use No     Allergies   Neurontin [gabapentin]   Review of Systems Review of Systems  Constitutional: Negative for chills and fever.  Respiratory: Negative for shortness of breath.   Cardiovascular: Positive for chest pain.  Gastrointestinal: Positive for abdominal distention, abdominal pain and nausea. Negative for constipation, diarrhea and vomiting.  Genitourinary: Positive for difficulty urinating. Negative for flank pain, frequency, hematuria, penile pain and penile swelling.  Musculoskeletal: Negative for back pain, myalgias and neck pain.  Skin: Negative for rash and wound.  Neurological: Negative for dizziness, weakness,  light-headedness, numbness and headaches.  All other systems reviewed and are negative.    Physical Exam Updated Vital Signs BP 116/83   Pulse 92   Temp 97.6 F (36.4 C) (Oral)   Resp (!) 21   Ht 5\' 5"  (1.651 m)   Wt 200 lb (90.7 kg)   SpO2 93%   BMI 33.28 kg/m   Physical Exam  Constitutional: He is oriented to person, place, and time. He appears well-developed and well-nourished.  Appears uncomfortable  HENT:  Head: Normocephalic and  atraumatic.  Mouth/Throat: Oropharynx is clear and moist.  Eyes: EOM are normal. Pupils are equal, round, and reactive to light.  Neck: Normal range of motion. Neck supple.  Cardiovascular: Normal rate and regular rhythm.   Pulmonary/Chest: Effort normal and breath sounds normal.  Abdominal: Soft. Bowel sounds are normal. There is tenderness. There is no rebound and no guarding.  Suprapubic tenderness and fullness.  Musculoskeletal: Normal range of motion. He exhibits no edema or tenderness.  Neurological: He is alert and oriented to person, place, and time.  Skin: Skin is warm and dry. No rash noted. No erythema.  Psychiatric: He has a normal mood and affect. His behavior is normal.  Nursing note and vitals reviewed.    ED Treatments / Results  Labs (all labs ordered are listed, but only abnormal results are displayed) Labs Reviewed  CBC WITH DIFFERENTIAL/PLATELET - Abnormal; Notable for the following:       Result Value   Platelets 124 (*)    All other components within normal limits  COMPREHENSIVE METABOLIC PANEL - Abnormal; Notable for the following:    Chloride 98 (*)    Glucose, Bld 171 (*)    All other components within normal limits  URINALYSIS, ROUTINE W REFLEX MICROSCOPIC - Abnormal; Notable for the following:    Specific Gravity, Urine 1.004 (*)    Hgb urine dipstick SMALL (*)    All other components within normal limits  LIPASE, BLOOD    EKG  EKG Interpretation None       Radiology No results found.  Procedures Procedures (including critical care time)  Medications Ordered in ED Medications - No data to display   Initial Impression / Assessment and Plan / ED Course  I have reviewed the triage vital signs and the nursing notes.  Pertinent labs & imaging results that were available during my care of the patient were reviewed by me and considered in my medical decision making (see chart for details).     That  bladder scanned with greater than 700  mL of urine. Full catheter was placed by nursing staff. Has produced 1100 mL of urine. Patient states he is feeling much better. Leg bag placed. Will start back on Flomax. Patient advised to follow-up closely with his urologist. Return precautions given. Final Clinical Impressions(s) / ED Diagnoses   Final diagnoses:  Urinary retention    New Prescriptions Current Discharge Medication List       Julianne Rice, MD 10/19/16 1007

## 2016-10-19 NOTE — ED Triage Notes (Signed)
Pt states he hasn't urinated since yesterday afternoon.

## 2016-10-27 DIAGNOSIS — R339 Retention of urine, unspecified: Secondary | ICD-10-CM | POA: Diagnosis not present

## 2016-10-30 DIAGNOSIS — N401 Enlarged prostate with lower urinary tract symptoms: Secondary | ICD-10-CM | POA: Diagnosis not present

## 2016-10-30 DIAGNOSIS — G894 Chronic pain syndrome: Secondary | ICD-10-CM | POA: Diagnosis not present

## 2016-10-30 DIAGNOSIS — M1991 Primary osteoarthritis, unspecified site: Secondary | ICD-10-CM | POA: Diagnosis not present

## 2016-10-30 DIAGNOSIS — Z1389 Encounter for screening for other disorder: Secondary | ICD-10-CM | POA: Diagnosis not present

## 2016-11-03 DIAGNOSIS — R339 Retention of urine, unspecified: Secondary | ICD-10-CM | POA: Diagnosis not present

## 2016-12-09 DIAGNOSIS — Z1389 Encounter for screening for other disorder: Secondary | ICD-10-CM | POA: Diagnosis not present

## 2016-12-09 DIAGNOSIS — E782 Mixed hyperlipidemia: Secondary | ICD-10-CM | POA: Diagnosis not present

## 2016-12-09 DIAGNOSIS — G894 Chronic pain syndrome: Secondary | ICD-10-CM | POA: Diagnosis not present

## 2016-12-09 DIAGNOSIS — M2550 Pain in unspecified joint: Secondary | ICD-10-CM | POA: Diagnosis not present

## 2017-02-08 DIAGNOSIS — G894 Chronic pain syndrome: Secondary | ICD-10-CM | POA: Diagnosis not present

## 2017-02-08 DIAGNOSIS — N400 Enlarged prostate without lower urinary tract symptoms: Secondary | ICD-10-CM | POA: Diagnosis not present

## 2017-02-08 DIAGNOSIS — Z1389 Encounter for screening for other disorder: Secondary | ICD-10-CM | POA: Diagnosis not present

## 2017-02-08 DIAGNOSIS — M1991 Primary osteoarthritis, unspecified site: Secondary | ICD-10-CM | POA: Diagnosis not present

## 2017-05-03 DIAGNOSIS — G894 Chronic pain syndrome: Secondary | ICD-10-CM | POA: Diagnosis not present

## 2017-05-03 DIAGNOSIS — M2550 Pain in unspecified joint: Secondary | ICD-10-CM | POA: Diagnosis not present

## 2017-05-19 DIAGNOSIS — E782 Mixed hyperlipidemia: Secondary | ICD-10-CM | POA: Diagnosis not present

## 2017-05-19 DIAGNOSIS — M1991 Primary osteoarthritis, unspecified site: Secondary | ICD-10-CM | POA: Diagnosis not present

## 2017-05-19 DIAGNOSIS — Z0001 Encounter for general adult medical examination with abnormal findings: Secondary | ICD-10-CM | POA: Diagnosis not present

## 2017-05-19 DIAGNOSIS — Z1389 Encounter for screening for other disorder: Secondary | ICD-10-CM | POA: Diagnosis not present

## 2017-05-19 DIAGNOSIS — G894 Chronic pain syndrome: Secondary | ICD-10-CM | POA: Diagnosis not present

## 2017-05-19 DIAGNOSIS — Z23 Encounter for immunization: Secondary | ICD-10-CM | POA: Diagnosis not present

## 2017-05-19 DIAGNOSIS — N400 Enlarged prostate without lower urinary tract symptoms: Secondary | ICD-10-CM | POA: Diagnosis not present

## 2017-05-19 DIAGNOSIS — Z0000 Encounter for general adult medical examination without abnormal findings: Secondary | ICD-10-CM | POA: Diagnosis not present

## 2017-05-19 DIAGNOSIS — J449 Chronic obstructive pulmonary disease, unspecified: Secondary | ICD-10-CM | POA: Diagnosis not present

## 2017-05-20 DIAGNOSIS — E119 Type 2 diabetes mellitus without complications: Secondary | ICD-10-CM | POA: Diagnosis not present

## 2017-05-20 LAB — BASIC METABOLIC PANEL
BUN: 6 (ref 4–21)
CREATININE: 0.9 (ref <=1.3)

## 2017-05-20 LAB — LIPID PANEL
Cholesterol: 131 (ref 0–200)
HDL: 47 (ref 35–70)
LDL CALC: 55
Triglycerides: 145 (ref 40–160)

## 2017-05-20 LAB — TSH: TSH: 0.36 — AB (ref <=5.90)

## 2017-05-20 LAB — HEMOGLOBIN A1C: Hemoglobin A1C: 10.4

## 2017-06-24 ENCOUNTER — Ambulatory Visit: Payer: Self-pay | Admitting: "Endocrinology

## 2017-07-26 ENCOUNTER — Encounter: Payer: Self-pay | Admitting: "Endocrinology

## 2017-07-26 ENCOUNTER — Ambulatory Visit: Payer: Medicare Other | Admitting: "Endocrinology

## 2017-07-26 VITALS — BP 129/81 | HR 89 | Ht 64.0 in | Wt 198.0 lb

## 2017-07-26 DIAGNOSIS — E1165 Type 2 diabetes mellitus with hyperglycemia: Secondary | ICD-10-CM | POA: Insufficient documentation

## 2017-07-26 DIAGNOSIS — E782 Mixed hyperlipidemia: Secondary | ICD-10-CM

## 2017-07-26 DIAGNOSIS — E0590 Thyrotoxicosis, unspecified without thyrotoxic crisis or storm: Secondary | ICD-10-CM | POA: Insufficient documentation

## 2017-07-26 MED ORDER — GLUCOSE BLOOD VI STRP: ORAL_STRIP | 2 refills | Status: DC

## 2017-07-26 MED ORDER — ACCU-CHEK GUIDE W/DEVICE KIT: 1.0000 | PACK | 0 refills | Status: DC

## 2017-07-26 NOTE — Progress Notes (Signed)
Consult Note       07/26/2017, 5:58 PM   Subjective:    Patient ID: Victor Fields, male    DOB: 1955/07/25.  Victor Fields is being seen in consultation for management of currently uncontrolled symptomatic diabetes requested by  Redmond [employer], MD.   Past Medical History:  Diagnosis Date  . Back pain   . Elevated cholesterol   . Enlarged prostate   . Hypertension    Past Surgical History:  Procedure Laterality Date  . crush injuries    . LUNG SURGERY     Social History   Socioeconomic History  . Marital status: Married    Spouse name: None  . Number of children: None  . Years of education: None  . Highest education level: None  Social Needs  . Financial resource strain: None  . Food insecurity - worry: None  . Food insecurity - inability: None  . Transportation needs - medical: None  . Transportation needs - non-medical: None  Occupational History  . None  Tobacco Use  . Smoking status: Former Smoker    Types: Cigarettes  . Smokeless tobacco: Never Used  Substance and Sexual Activity  . Alcohol use: No  . Drug use: No  . Sexual activity: None  Other Topics Concern  . None  Social History Narrative      Used to be a Geophysicist/field seismologist   Used to smoke until 2012   Used to drink heavily a long time ago   Prior drug use   Current wife is 2nd wife-married 2007   Finished high school-did some college   Outpatient Encounter Medications as of 07/26/2017  Medication Sig  . ALPRAZolam (XANAX) 0.5 MG tablet Take 0.5 mg by mouth 4 (four) times daily.  . cyclobenzaprine (FLEXERIL) 10 MG tablet Take 10 mg by mouth 3 (three) times daily as needed for muscle spasms.  . DULoxetine (CYMBALTA) 30 MG capsule Take 30 mg by mouth daily.  . furosemide (LASIX) 20 MG tablet Take 20 mg by mouth daily.  . metFORMIN (GLUCOPHAGE) 500 MG tablet Take 500 mg by mouth 2 (two) times daily with a meal.  . morphine  (MS CONTIN) 60 MG 12 hr tablet Take 60 mg by mouth every 12 (twelve) hours.  . Potassium Chloride Crys ER (KLOR-CON M20 PO) Take by mouth daily.  . simvastatin (ZOCOR) 40 MG tablet Take 40 mg by mouth daily.  . traZODone (DESYREL) 50 MG tablet Take 50 mg by mouth at bedtime.  . [DISCONTINUED] tamsulosin (FLOMAX) 0.4 MG CAPS capsule Take 1 capsule (0.4 mg total) by mouth daily after supper.  . Blood Glucose Monitoring Suppl (ACCU-CHEK GUIDE) w/Device KIT 1 Piece by Does not apply route as directed.  Marland Kitchen glucose blood (ACCU-CHEK GUIDE) test strip Use as instructed  . Multiple Vitamin (MULTIVITAMIN WITH MINERALS) TABS Take 1 tablet by mouth daily.  . [DISCONTINUED] ALPRAZolam (XANAX) 1 MG tablet Take 1 mg by mouth at bedtime as needed. For sleep  . [DISCONTINUED] Blood Glucose Monitoring Suppl (ACCU-CHEK GUIDE) w/Device KIT 1 Piece by Does not apply route as directed.  . [DISCONTINUED] citalopram (CELEXA) 40  MG tablet Take 40 mg by mouth daily.  . [DISCONTINUED] fish oil-omega-3 fatty acids 1000 MG capsule Take 1 g by mouth daily.  . [DISCONTINUED] glucose blood (ACCU-CHEK GUIDE) test strip Use as instructed  . [DISCONTINUED] levofloxacin (LEVAQUIN) 500 MG tablet Take 1 tablet (500 mg total) by mouth daily.  . [DISCONTINUED] loratadine (CLARITIN) 10 MG tablet Take 10 mg by mouth daily.  . [DISCONTINUED] mometasone-formoterol (DULERA) 100-5 MCG/ACT AERO Inhale 2 puffs into the lungs 2 (two) times daily.  . [DISCONTINUED] oxyCODONE (OXYCONTIN) 80 MG 12 hr tablet Take 80 mg by mouth 3 (three) times daily.  . [DISCONTINUED] predniSONE (DELTASONE) 20 MG tablet Take 3 tablets (60 mg total) by mouth daily before breakfast.   No facility-administered encounter medications on file as of 07/26/2017.     ALLERGIES: Allergies  Allergen Reactions  . Neurontin [Gabapentin] Other (See Comments)    hallucinations    VACCINATION STATUS: Immunization History  Administered Date(s) Administered  .  Influenza-Unspecified 04/05/2014    Diabetes  He presents for his initial diabetic visit. He has type 2 diabetes mellitus. Onset time: Patient's history that he did not know he has diabetes, however metformin was prescribed for him in November 2018, but decided not to take the metformin for unclear reasons. His disease course has been worsening. There are no hypoglycemic associated symptoms. Pertinent negatives for hypoglycemia include no confusion, headaches, pallor or seizures. Associated symptoms include blurred vision, polydipsia and polyuria. Pertinent negatives for diabetes include no chest pain, no fatigue, no polyphagia and no weakness. There are no hypoglycemic complications. Symptoms are worsening. There are no diabetic complications. Risk factors for coronary artery disease include diabetes mellitus, dyslipidemia, hypertension, male sex, obesity, tobacco exposure and sedentary lifestyle. When asked about current treatments, none were reported. His weight is stable. He is following a generally unhealthy diet. When asked about meal planning, he reported none. He has not had a previous visit with a dietitian. He never participates in exercise. (He did not bring any meter nor logs saying he does not have any meter to check blood glucose with.) An ACE inhibitor/angiotensin II receptor blocker is not being taken. He does not see a podiatrist.Eye exam is not current.  Hyperlipidemia  This is a chronic problem. The current episode started more than 1 year ago. Exacerbating diseases include diabetes and obesity. Pertinent negatives include no chest pain, myalgias or shortness of breath. Current antihyperlipidemic treatment includes statins. Risk factors for coronary artery disease include diabetes mellitus, dyslipidemia, hypertension, male sex, a sedentary lifestyle and family history.  Hypertension  This is a chronic problem. The current episode started more than 1 year ago. The problem is controlled.  Associated symptoms include blurred vision. Pertinent negatives include no chest pain, headaches, neck pain, palpitations or shortness of breath. Risk factors for coronary artery disease include diabetes mellitus, dyslipidemia, male gender, obesity, smoking/tobacco exposure, sedentary lifestyle and family history.      Review of Systems  Constitutional: Negative for chills, fatigue, fever and unexpected weight change.  HENT: Negative for dental problem, mouth sores and trouble swallowing.   Eyes: Positive for blurred vision. Negative for visual disturbance.  Respiratory: Negative for cough, choking, chest tightness, shortness of breath and wheezing.   Cardiovascular: Negative for chest pain, palpitations and leg swelling.  Gastrointestinal: Negative for abdominal distention, abdominal pain, constipation, diarrhea, nausea and vomiting.  Endocrine: Positive for polydipsia and polyuria. Negative for polyphagia.  Genitourinary: Negative for dysuria, flank pain, hematuria and urgency.  Musculoskeletal: Positive for  back pain and gait problem. Negative for myalgias and neck pain.  Skin: Negative for pallor, rash and wound.  Neurological: Negative for seizures, syncope, weakness, numbness and headaches.  Psychiatric/Behavioral: Negative for confusion and dysphoric mood.    Objective:    BP 129/81   Pulse 89   Ht '5\' 4"'  (1.626 m)   Wt 198 lb (89.8 kg)   BMI 33.99 kg/m   Wt Readings from Last 3 Encounters:  07/26/17 198 lb (89.8 kg)  10/19/16 200 lb (90.7 kg)  08/24/14 200 lb (90.7 kg)     Physical Exam  Constitutional: He is oriented to person, place, and time. He appears well-developed. He is cooperative. No distress.  Walks with a cane due to back pain  HENT:  Head: Normocephalic and atraumatic.  Eyes: EOM are normal.  Neck: Normal range of motion. Neck supple. No tracheal deviation present. No thyromegaly present.  Cardiovascular: Normal rate, S1 normal, S2 normal and normal heart  sounds. Exam reveals no gallop.  No murmur heard. Pulses:      Dorsalis pedis pulses are 1+ on the right side, and 1+ on the left side.       Posterior tibial pulses are 1+ on the right side, and 1+ on the left side.  Pulmonary/Chest: Breath sounds normal. No respiratory distress. He has no wheezes.  Abdominal: Soft. Bowel sounds are normal. He exhibits no distension. There is no tenderness. There is no guarding and no CVA tenderness.  Musculoskeletal: He exhibits no edema.       Right shoulder: He exhibits no swelling and no deformity.  Neurological: He is alert and oriented to person, place, and time. He has normal strength and normal reflexes. No cranial nerve deficit or sensory deficit. Gait normal.  Skin: Skin is warm and dry. No rash noted. No cyanosis. Nails show no clubbing.  Psychiatric: His speech is normal. Cognition and memory are normal.  Reluctant affect.    CMP ( most recent) CMP     Component Value Date/Time   NA 137 10/19/2016 0732   K 3.6 10/19/2016 0732   CL 98 (L) 10/19/2016 0732   CO2 30 10/19/2016 0732   GLUCOSE 171 (H) 10/19/2016 0732   BUN 6 10/19/2016 0732   CREATININE 0.99 10/19/2016 0732   CALCIUM 9.1 10/19/2016 0732   PROT 7.8 10/19/2016 0732   ALBUMIN 3.6 10/19/2016 0732   AST 39 10/19/2016 0732   ALT 23 10/19/2016 0732   ALKPHOS 100 10/19/2016 0732   BILITOT 0.6 10/19/2016 0732   GFRNONAA >60 10/19/2016 0732   GFRAA >60 10/19/2016 0732   Recent Results (from the past 2160 hour(s))  Basic metabolic panel     Status: None   Collection Time: 05/20/17 12:00 AM  Result Value Ref Range   BUN 6 4 - 21   Creatinine 0.9 0.6 - 1.3  Lipid panel     Status: None   Collection Time: 05/20/17 12:00 AM  Result Value Ref Range   Triglycerides 145 40 - 160   Cholesterol 131 0 - 200   HDL 47 35 - 70   LDL Cholesterol 55   Hemoglobin A1c     Status: None   Collection Time: 05/20/17 12:00 AM  Result Value Ref Range   Hemoglobin A1C 10.4   TSH     Status:  Abnormal   Collection Time: 05/20/17 12:00 AM  Result Value Ref Range   TSH 0.36 (A) 0.41 - 5.90     Assessment &  Plan:   1. Uncontrolled type 2 diabetes mellitus with hyperglycemia (HCC)  - Victor Fields has currently uncontrolled symptomatic type 2 DM since  62 years of age,  with most recent A1c of 10.4 %. Recent labs reviewed.  -his diabetes is complicated by obesity/sedentary life and Victor Fields remains at a high risk for more acute and chronic complications which include CAD, CVA, CKD, retinopathy, and neuropathy. These are all discussed in detail with the patient.  - I have counseled him on diet management and weight loss, by adopting a carbohydrate restricted/protein rich diet.  - Suggestion is made for him to avoid simple carbohydrates  from his diet including Cakes, Sweet Desserts, Ice Cream, Soda (diet and regular), Sweet Tea, Candies, Chips, Cookies, Store Bought Juices, Alcohol in Excess of  1-2 drinks a day, Artificial Sweeteners, and "Sugar-free" Products. This will help patient to have stable blood glucose profile and potentially avoid unintended weight gain.  - I encouraged him to switch to  unprocessed or minimally processed complex starch and increased protein intake (animal or plant source), fruits, and vegetables.  - he is advised to stick to a routine mealtimes to eat 3 meals  a day and avoid unnecessary snacks ( to snack only to correct hypoglycemia).   - he will be scheduled with Jearld Fenton, RDN, CDE for individualized diabetes education.  - I have approached him with the following individualized plan to manage diabetes and patient agrees:   - Patient  reports that he did not know he has diabetes, present with A1c of 10.4%.  - He has not taken the metformin which was prescribed for him in November 2018.  - He may require more medications, however I approached him to initiate strict monitoring of blood glucose 4 times a day-before meals and at bedtime,  and return in one week with his meter and logs.   - The meantime, since there is no contraindication, I advised him to start taking metformin 500 mg by mouth twice a day with meals.   -Patient is encouraged to call clinic for blood glucose levels less than 70 or above 300 mg /dl.  - he will be considered for incretin therapy as appropriate next visit. - Patient specific target  A1c;  LDL, HDL, Triglycerides, and  Waist Circumference were discussed in detail.  2) BP/HTN: Controlled. He is not on ACEI/ARB. 3) Lipids/HPL:   controlled,.   Patient is advised to continue statins. 4)  Weight/Diet: CDE Consult will be initiated , exercise, and detailed carbohydrates information provided.  5) subclinical hyperthyroidism  - His TSH was 0.36 on 05/20/2017 which is consistent with mild subclinical hyperthyroidism. He would not require definitive treatment at bedtime. He will have for profile thyroid function test along with his next labs.  6) Chronic Care/Health Maintenance:  -he  is on Statin medications and  is encouraged to continue to follow up with Ophthalmology, Dentist,  Podiatrist at least yearly or according to recommendations, and advised to  stay away from smoking. I have recommended yearly flu vaccine and pneumonia vaccination at least every 5 years; moderate intensity exercise for up to 150 minutes weekly; and  sleep for at least 7 hours a day.  - I advised patient to maintain close follow up with [employer]mond [employer], MD for primary care needs.  - Time spent with the patient: 1 hour, of which >50% was spent in obtaining information about his symptoms, reviewing his previous labs, evaluations, and treatments, counseling him about his  currently uncontrolled type 2 diabetes, hypertension, hyperlipidemia, and developing a plan for long term treatment; his  questions were answered to his satisfaction.  Follow up plan: - Return in about 5 weeks (around 08/30/2017) for meter, and logs.  Glade Lloyd, MD [facility] [address] [place], [place] [address] Phone: [phone]  Fax: [phone]    07/26/2017, 5:58 PM  This note was partially dictated with voice recognition software. Similar sounding words can be transcribed inadequately or may not  be corrected upon review.

## 2017-07-26 NOTE — Patient Instructions (Signed)

## 2017-08-02 DIAGNOSIS — M1991 Primary osteoarthritis, unspecified site: Secondary | ICD-10-CM | POA: Diagnosis not present

## 2017-08-02 DIAGNOSIS — G894 Chronic pain syndrome: Secondary | ICD-10-CM | POA: Diagnosis not present

## 2017-08-20 ENCOUNTER — Other Ambulatory Visit: Payer: Self-pay | Admitting: "Endocrinology

## 2017-08-20 DIAGNOSIS — E1165 Type 2 diabetes mellitus with hyperglycemia: Secondary | ICD-10-CM | POA: Diagnosis not present

## 2017-08-20 DIAGNOSIS — E0590 Thyrotoxicosis, unspecified without thyrotoxic crisis or storm: Secondary | ICD-10-CM | POA: Diagnosis not present

## 2017-08-20 DIAGNOSIS — R7309 Other abnormal glucose: Secondary | ICD-10-CM | POA: Diagnosis not present

## 2017-08-21 LAB — COMPREHENSIVE METABOLIC PANEL
A/G RATIO: 1.1 — AB (ref 1.2–2.2)
ALT: 17 IU/L (ref 0–44)
AST: 30 IU/L (ref 0–40)
Albumin: 3.7 g/dL (ref 3.6–4.8)
Alkaline Phosphatase: 104 IU/L (ref 39–117)
BUN/Creatinine Ratio: 9 — ABNORMAL LOW (ref 10–24)
BUN: 7 mg/dL — ABNORMAL LOW (ref 8–27)
Bilirubin Total: 0.5 mg/dL (ref 0.0–1.2)
CALCIUM: 9.3 mg/dL (ref 8.6–10.2)
CO2: 29 mmol/L (ref 20–29)
Chloride: 98 mmol/L (ref 96–106)
Creatinine, Ser: 0.79 mg/dL (ref 0.76–1.27)
GFR calc non Af Amer: 97 mL/min/{1.73_m2} (ref >59)
GFR, EST AFRICAN AMERICAN: 112 mL/min/{1.73_m2} (ref >59)
GLOBULIN, TOTAL: 3.4 g/dL (ref 1.5–4.5)
Glucose: 119 mg/dL — ABNORMAL HIGH (ref 65–99)
POTASSIUM: 4.3 mmol/L (ref 3.5–5.2)
Sodium: 140 mmol/L (ref 134–144)
TOTAL PROTEIN: 7.1 g/dL (ref 6.0–8.5)

## 2017-08-21 LAB — HGB A1C W/O EAG: Hgb A1c MFr Bld: 8.4 % — ABNORMAL HIGH (ref 4.8–5.6)

## 2017-08-21 LAB — T4, FREE: Free T4: 1.07 ng/dL (ref 0.82–1.77)

## 2017-08-21 LAB — VITAMIN D 25 HYDROXY (VIT D DEFICIENCY, FRACTURES): Vit D, 25-Hydroxy: 27.2 ng/mL — ABNORMAL LOW (ref 30.0–100.0)

## 2017-08-21 LAB — TSH: TSH: 0.738 u[IU]/mL (ref 0.450–4.500)

## 2017-08-24 ENCOUNTER — Ambulatory Visit: Payer: Medicare Other | Admitting: Nutrition

## 2017-08-30 ENCOUNTER — Ambulatory Visit (INDEPENDENT_AMBULATORY_CARE_PROVIDER_SITE_OTHER): Payer: Medicare Other | Admitting: "Endocrinology

## 2017-08-30 ENCOUNTER — Encounter: Payer: Self-pay | Admitting: "Endocrinology

## 2017-08-30 VITALS — BP 114/75 | HR 94 | Ht 64.0 in | Wt 200.0 lb

## 2017-08-30 DIAGNOSIS — E0590 Thyrotoxicosis, unspecified without thyrotoxic crisis or storm: Secondary | ICD-10-CM | POA: Diagnosis not present

## 2017-08-30 DIAGNOSIS — E782 Mixed hyperlipidemia: Secondary | ICD-10-CM

## 2017-08-30 DIAGNOSIS — Z6834 Body mass index (BMI) 34.0-34.9, adult: Secondary | ICD-10-CM | POA: Diagnosis not present

## 2017-08-30 DIAGNOSIS — E6609 Other obesity due to excess calories: Secondary | ICD-10-CM

## 2017-08-30 DIAGNOSIS — E1165 Type 2 diabetes mellitus with hyperglycemia: Secondary | ICD-10-CM

## 2017-08-30 NOTE — Patient Instructions (Signed)

## 2017-08-30 NOTE — Progress Notes (Signed)
Consult Note       08/30/2017, 10:35 AM   Subjective:    Patient ID: Victor Fields, male    DOB: 09-07-1955.  Victor Fields is being seen in follow-up for management of currently uncontrolled symptomatic diabetes requested by  Redmond School, MD.   Past Medical History:  Diagnosis Date  . Back pain   . Elevated cholesterol   . Enlarged prostate   . Hypertension    Past Surgical History:  Procedure Laterality Date  . crush injuries    . LUNG SURGERY     Social History   Socioeconomic History  . Marital status: Married    Spouse name: None  . Number of children: None  . Years of education: None  . Highest education level: None  Social Needs  . Financial resource strain: None  . Food insecurity - worry: None  . Food insecurity - inability: None  . Transportation needs - medical: None  . Transportation needs - non-medical: None  Occupational History  . None  Tobacco Use  . Smoking status: Former Smoker    Types: Cigarettes  . Smokeless tobacco: Never Used  Substance and Sexual Activity  . Alcohol use: No  . Drug use: No  . Sexual activity: None  Other Topics Concern  . None  Social History Narrative      Used to be a Geophysicist/field seismologist   Used to smoke until 2012   Used to drink heavily a long time ago   Prior drug use   Current wife is 2nd wife-married 2007   Finished high school-did some college   Outpatient Encounter Medications as of 08/30/2017  Medication Sig  . ALPRAZolam (XANAX) 0.5 MG tablet Take 0.5 mg by mouth 4 (four) times daily.  . Blood Glucose Monitoring Suppl (ACCU-CHEK GUIDE) w/Device KIT 1 Piece by Does not apply route as directed.  . cyclobenzaprine (FLEXERIL) 10 MG tablet Take 10 mg by mouth 3 (three) times daily as needed for muscle spasms.  . DULoxetine (CYMBALTA) 30 MG capsule Take 30 mg by mouth daily.  . furosemide (LASIX) 20 MG tablet Take 20 mg by mouth daily.   Marland Kitchen glucose blood (ACCU-CHEK GUIDE) test strip Use as instructed  . metFORMIN (GLUCOPHAGE) 500 MG tablet Take 500 mg by mouth 2 (two) times daily with a meal.  . morphine (MS CONTIN) 60 MG 12 hr tablet Take 60 mg by mouth every 12 (twelve) hours.  . Multiple Vitamin (MULTIVITAMIN WITH MINERALS) TABS Take 1 tablet by mouth daily.  . Oxycodone HCl 10 MG TABS 3 (three) times daily.  . Potassium Chloride Crys ER (KLOR-CON M20 PO) Take by mouth daily.  . simvastatin (ZOCOR) 40 MG tablet Take 40 mg by mouth daily.  . traZODone (DESYREL) 50 MG tablet Take 50 mg by mouth at bedtime.   No facility-administered encounter medications on file as of 08/30/2017.     ALLERGIES: Allergies  Allergen Reactions  . Neurontin [Gabapentin] Other (See Comments)    hallucinations    VACCINATION STATUS: Immunization History  Administered Date(s) Administered  . Influenza-Unspecified 04/05/2014    Diabetes  He presents for  his follow-up diabetic visit. He has type 2 diabetes mellitus. Onset time: Patient's history that he did not know he has diabetes, however metformin was prescribed for him in November 2018, but decided not to take the metformin for unclear reasons. His disease course has been improving. There are no hypoglycemic associated symptoms. Pertinent negatives for hypoglycemia include no confusion, headaches, pallor or seizures. Associated symptoms include blurred vision. Pertinent negatives for diabetes include no chest pain, no fatigue, no polyphagia, no polyuria and no weakness. There are no hypoglycemic complications. Symptoms are improving. There are no diabetic complications. Risk factors for coronary artery disease include diabetes mellitus, dyslipidemia, hypertension, male sex, obesity, tobacco exposure and sedentary lifestyle. When asked about current treatments, none were reported. His weight is stable. He is following a generally unhealthy diet. When asked about meal planning, he reported none.  He has not had a previous visit with a dietitian. He never participates in exercise. His breakfast blood glucose range is generally 110-130 mg/dl. His bedtime blood glucose range is generally 140-180 mg/dl. His overall blood glucose range is 140-180 mg/dl. An ACE inhibitor/angiotensin II receptor blocker is not being taken. He does not see a podiatrist.Eye exam is not current.  Hyperlipidemia  This is a chronic problem. The current episode started more than 1 year ago. Exacerbating diseases include diabetes and obesity. Pertinent negatives include no chest pain, myalgias or shortness of breath. Current antihyperlipidemic treatment includes statins. Risk factors for coronary artery disease include diabetes mellitus, dyslipidemia, hypertension, male sex, a sedentary lifestyle and family history.  Hypertension  This is a chronic problem. The current episode started more than 1 year ago. The problem is controlled. Associated symptoms include blurred vision. Pertinent negatives include no chest pain, headaches, neck pain, palpitations or shortness of breath. Risk factors for coronary artery disease include diabetes mellitus, dyslipidemia, male gender, obesity, smoking/tobacco exposure, sedentary lifestyle and family history.      Review of Systems  Constitutional: Negative for chills, fatigue, fever and unexpected weight change.  HENT: Negative for dental problem, mouth sores and trouble swallowing.   Eyes: Positive for blurred vision. Negative for visual disturbance.  Respiratory: Negative for cough, choking, chest tightness, shortness of breath and wheezing.   Cardiovascular: Negative for chest pain, palpitations and leg swelling.  Gastrointestinal: Negative for abdominal distention, abdominal pain, constipation, diarrhea, nausea and vomiting.  Endocrine: Negative for polyphagia and polyuria.  Genitourinary: Negative for dysuria, flank pain, hematuria and urgency.  Musculoskeletal: Positive for back  pain and gait problem. Negative for myalgias and neck pain.  Skin: Negative for pallor, rash and wound.  Neurological: Negative for seizures, syncope, weakness, numbness and headaches.  Psychiatric/Behavioral: Negative for confusion and dysphoric mood.    Objective:    BP 114/75   Pulse 94   Ht '5\' 4"'  (1.626 m)   Wt 200 lb (90.7 kg)   BMI 34.33 kg/m   Wt Readings from Last 3 Encounters:  08/30/17 200 lb (90.7 kg)  07/26/17 198 lb (89.8 kg)  10/19/16 200 lb (90.7 kg)     Physical Exam  Constitutional: He is oriented to person, place, and time. He appears well-developed. He is cooperative. No distress.  Walks with a cane due to back pain  HENT:  Head: Normocephalic and atraumatic.  Eyes: EOM are normal.  Neck: Normal range of motion. Neck supple. No tracheal deviation present. No thyromegaly present.  Cardiovascular: Normal rate, S1 normal, S2 normal and normal heart sounds. Exam reveals no gallop.  No murmur heard.  Pulses:      Dorsalis pedis pulses are 1+ on the right side, and 1+ on the left side.       Posterior tibial pulses are 1+ on the right side, and 1+ on the left side.  Pulmonary/Chest: Breath sounds normal. No respiratory distress. He has no wheezes.  Abdominal: Soft. Bowel sounds are normal. He exhibits no distension. There is no tenderness. There is no guarding and no CVA tenderness.  Musculoskeletal: He exhibits no edema.       Right shoulder: He exhibits no swelling and no deformity.  Neurological: He is alert and oriented to person, place, and time. He has normal strength and normal reflexes. No cranial nerve deficit or sensory deficit. Gait normal.  Skin: Skin is warm and dry. No rash noted. No cyanosis. Nails show no clubbing.  Psychiatric: His speech is normal. Cognition and memory are normal.  Reluctant affect.    CMP ( most recent) CMP     Component Value Date/Time   NA 140 08/20/2017 0922   K 4.3 08/20/2017 0922   CL 98 08/20/2017 0922   CO2 29  08/20/2017 0922   GLUCOSE 119 (H) 08/20/2017 0922   GLUCOSE 171 (H) 10/19/2016 0732   BUN 7 (L) 08/20/2017 0922   CREATININE 0.79 08/20/2017 0922   CALCIUM 9.3 08/20/2017 0922   PROT 7.1 08/20/2017 0922   ALBUMIN 3.7 08/20/2017 0922   AST 30 08/20/2017 0922   ALT 17 08/20/2017 0922   ALKPHOS 104 08/20/2017 0922   BILITOT 0.5 08/20/2017 0922   GFRNONAA 97 08/20/2017 0922   GFRAA 112 08/20/2017 0922   Recent Results (from the past 2160 hour(s))  Comprehensive metabolic panel     Status: Abnormal   Collection Time: 08/20/17  9:22 AM  Result Value Ref Range   Glucose 119 (H) 65 - 99 mg/dL   BUN 7 (L) 8 - 27 mg/dL   Creatinine, Ser 0.79 0.76 - 1.27 mg/dL   GFR calc non Af Amer 97 >59 mL/min/1.73   GFR calc Af Amer 112 >59 mL/min/1.73   BUN/Creatinine Ratio 9 (L) 10 - 24   Sodium 140 134 - 144 mmol/L   Potassium 4.3 3.5 - 5.2 mmol/L   Chloride 98 96 - 106 mmol/L   CO2 29 20 - 29 mmol/L   Calcium 9.3 8.6 - 10.2 mg/dL   Total Protein 7.1 6.0 - 8.5 g/dL   Albumin 3.7 3.6 - 4.8 g/dL   Globulin, Total 3.4 1.5 - 4.5 g/dL   Albumin/Globulin Ratio 1.1 (L) 1.2 - 2.2   Bilirubin Total 0.5 0.0 - 1.2 mg/dL   Alkaline Phosphatase 104 39 - 117 IU/L   AST 30 0 - 40 IU/L   ALT 17 0 - 44 IU/L  Hgb A1c w/o eAG     Status: Abnormal   Collection Time: 08/20/17  9:22 AM  Result Value Ref Range   Hgb A1c MFr Bld 8.4 (H) 4.8 - 5.6 %    Comment:          Prediabetes: 5.7 - 6.4          Diabetes: >6.4          Glycemic control for adults with diabetes: <7.0   T4, free     Status: None   Collection Time: 08/20/17  9:22 AM  Result Value Ref Range   Free T4 1.07 0.82 - 1.77 ng/dL  TSH     Status: None   Collection Time: 08/20/17  9:22  AM  Result Value Ref Range   TSH 0.738 0.450 - 4.500 uIU/mL  VITAMIN D 25 Hydroxy (Vit-D Deficiency, Fractures)     Status: Abnormal   Collection Time: 08/20/17  9:22 AM  Result Value Ref Range   Vit D, 25-Hydroxy 27.2 (L) 30.0 - 100.0 ng/mL    Comment: Vitamin  D deficiency has been defined by the [place] practice guideline as a level of serum 25-OH vitamin D less than 20 ng/mL (1,2). The Endocrine Society went on to further define vitamin D insufficiency as a level between 21 and 29 ng/mL (2). 1. IOM (Institute of Medicine). 2010. Dietary reference    intakes for calcium and D. [place]: The    [employer]. 2. Holick MF, Binkley Bullhead, Bischoff-Ferrari HA, et al.    Evaluation, treatment, and prevention of vitamin D    deficiency: an Endocrine Society clinical practice    guideline. JCEM. 2011 Jul; 96(7):1911-30.      Assessment & Plan:   1. Uncontrolled type 2 diabetes mellitus with hyperglycemia (HCC)  - Victor Fields has currently uncontrolled symptomatic type 2 DM since  62 years of age. -He returns with significantly improved blood glucose profile both fasting and postprandial.  His repeat labs show A1c of 8.4% significantly improving from 10.4%.  Recent labs reviewed.  -his diabetes is complicated by obesity/sedentary life and Victor Fields remains at a high risk for more acute and chronic complications which include CAD, CVA, CKD, retinopathy, and neuropathy. These are all discussed in detail with the patient.  - I have counseled him on diet management and weight loss, by adopting a carbohydrate restricted/protein rich diet.  -  Suggestion is made for him to avoid simple carbohydrates  from his diet including Cakes, Sweet Desserts / Pastries, Ice Cream, Soda (diet and regular), Sweet Tea, Candies, Chips, Cookies, Store Bought Juices, Alcohol in Excess of  1-2 drinks a day, Artificial Sweeteners, and "Sugar-free" Products. This will help patient to have stable blood glucose profile and potentially avoid unintended weight gain.   - I encouraged him to switch to  unprocessed or minimally processed complex starch and increased protein intake (animal or plant source), fruits, and  vegetables.  - he is advised to stick to a routine mealtimes to eat 3 meals  a day and avoid unnecessary snacks ( to snack only to correct hypoglycemia).   - he will be scheduled with Jearld Fenton, RDN, CDE for individualized diabetes education.  - I have approached him with the following individualized plan to manage diabetes and patient agrees:   -Based on his presentation with controlled glycemia, he will not require insulin treatment for now.  -   I advised him to continue  metformin 500 mg by mouth twice a day with meals.   -Patient is encouraged to call clinic for blood glucose levels less than 70 or above 300 mg /dl.  - he will be considered for incretin therapy as appropriate next visit. - Patient specific target  A1c;  LDL, HDL, Triglycerides, and  Waist Circumference were discussed in detail.  2) BP/HTN: His blood pressure is controlled to target at 114/75.  He is not on ACEI/ARB. 3) Lipids/HPL:   Controlled.  He is advised to continue his statins. 4)  Weight/Diet: CDE Consult has been initiated , exercise, and detailed carbohydrates information provided.  5) subclinical hyperthyroidism-resolved. Repeat thyroid function tests are within normal limits.  No intervention required.  6)  Chronic Care/Health Maintenance:  -he  is on Statin medications and  is encouraged to continue to follow up with Ophthalmology, Dentist,  Podiatrist at least yearly or according to recommendations, and advised to  stay away from smoking. I have recommended yearly flu vaccine and pneumonia vaccination at least every 5 years; moderate intensity exercise for up to 150 minutes weekly; and  sleep for at least 7 hours a day.  - I advised patient to maintain close follow up with Redmond School, MD for primary care needs.  - Time spent with the patient: 25 min, of which >50% was spent in reviewing his blood glucose logs , discussing his hypo- and hyper-glycemic episodes, reviewing his current and   previous labs and insulin doses and developing a plan to avoid hypo- and hyper-glycemia. Please refer to Patient Instructions for Blood Glucose Monitoring and Insulin/Medications Dosing Guide"  in media tab for additional information.   Follow up plan: - Return in about 3 months (around 11/27/2017) for follow up with pre-visit labs, meter, and logs.  Glade Lloyd, MD [facility] [address] [place], [place] [address] Phone: [phone]  Fax: [phone]    08/30/2017, 10:35 AM  This note was partially dictated with voice recognition software. Similar sounding words can be transcribed inadequately or may not  be corrected upon review.

## 2017-09-02 DIAGNOSIS — E876 Hypokalemia: Secondary | ICD-10-CM | POA: Diagnosis not present

## 2017-10-27 DIAGNOSIS — Z1389 Encounter for screening for other disorder: Secondary | ICD-10-CM | POA: Diagnosis not present

## 2017-10-27 DIAGNOSIS — G894 Chronic pain syndrome: Secondary | ICD-10-CM | POA: Diagnosis not present

## 2017-10-27 DIAGNOSIS — M1991 Primary osteoarthritis, unspecified site: Secondary | ICD-10-CM | POA: Diagnosis not present

## 2017-10-27 DIAGNOSIS — J019 Acute sinusitis, unspecified: Secondary | ICD-10-CM | POA: Diagnosis not present

## 2017-11-25 ENCOUNTER — Other Ambulatory Visit: Payer: Self-pay | Admitting: "Endocrinology

## 2017-11-25 DIAGNOSIS — E1165 Type 2 diabetes mellitus with hyperglycemia: Secondary | ICD-10-CM | POA: Diagnosis not present

## 2017-11-25 DIAGNOSIS — R7309 Other abnormal glucose: Secondary | ICD-10-CM | POA: Diagnosis not present

## 2017-11-26 LAB — COMPREHENSIVE METABOLIC PANEL
A/G RATIO: 1.2 (ref 1.2–2.2)
ALT: 26 IU/L (ref 0–44)
AST: 30 IU/L (ref 0–40)
Albumin: 4 g/dL (ref 3.6–4.8)
Alkaline Phosphatase: 122 IU/L — ABNORMAL HIGH (ref 39–117)
BUN / CREAT RATIO: 9 — AB (ref 10–24)
BUN: 8 mg/dL (ref 8–27)
Bilirubin Total: 0.4 mg/dL (ref 0.0–1.2)
CALCIUM: 9.3 mg/dL (ref 8.6–10.2)
CO2: 30 mmol/L — ABNORMAL HIGH (ref 20–29)
Chloride: 100 mmol/L (ref 96–106)
Creatinine, Ser: 0.9 mg/dL (ref 0.76–1.27)
GFR, EST AFRICAN AMERICAN: 106 mL/min/{1.73_m2} (ref >59)
GFR, EST NON AFRICAN AMERICAN: 92 mL/min/{1.73_m2} (ref >59)
GLOBULIN, TOTAL: 3.3 g/dL (ref 1.5–4.5)
Glucose: 88 mg/dL (ref 65–99)
POTASSIUM: 4.4 mmol/L (ref 3.5–5.2)
SODIUM: 143 mmol/L (ref 134–144)
Total Protein: 7.3 g/dL (ref 6.0–8.5)

## 2017-11-26 LAB — HGB A1C W/O EAG: Hgb A1c MFr Bld: 7.2 % — ABNORMAL HIGH (ref 4.8–5.6)

## 2017-12-02 ENCOUNTER — Encounter: Payer: Self-pay | Admitting: "Endocrinology

## 2017-12-02 ENCOUNTER — Ambulatory Visit (INDEPENDENT_AMBULATORY_CARE_PROVIDER_SITE_OTHER): Payer: Medicare Other | Admitting: "Endocrinology

## 2017-12-02 VITALS — BP 128/81 | HR 99 | Ht 64.0 in | Wt 200.0 lb

## 2017-12-02 DIAGNOSIS — E6609 Other obesity due to excess calories: Secondary | ICD-10-CM

## 2017-12-02 DIAGNOSIS — Z6834 Body mass index (BMI) 34.0-34.9, adult: Secondary | ICD-10-CM | POA: Diagnosis not present

## 2017-12-02 DIAGNOSIS — E0590 Thyrotoxicosis, unspecified without thyrotoxic crisis or storm: Secondary | ICD-10-CM

## 2017-12-02 DIAGNOSIS — E1165 Type 2 diabetes mellitus with hyperglycemia: Secondary | ICD-10-CM | POA: Diagnosis not present

## 2017-12-02 DIAGNOSIS — E782 Mixed hyperlipidemia: Secondary | ICD-10-CM | POA: Diagnosis not present

## 2017-12-02 DIAGNOSIS — E559 Vitamin D deficiency, unspecified: Secondary | ICD-10-CM | POA: Insufficient documentation

## 2017-12-02 NOTE — Patient Instructions (Signed)

## 2017-12-02 NOTE — Progress Notes (Signed)
Endocrinology follow-up note       12/02/2017, 11:16 AM   Subjective:    Patient ID: Victor Fields, male    DOB: [birth year].  Victor Fields is being seen in follow-up for management of currently uncontrolled symptomatic diabetes requested by  Redmond School, MD.   Past Medical History:  Diagnosis Date  . Back pain   . Elevated cholesterol   . Enlarged prostate   . Hypertension    Past Surgical History:  Procedure Laterality Date  . crush injuries    . LUNG SURGERY     Social History   Socioeconomic History  . Marital status: Married    Spouse name: Not on file  . Number of children: Not on file  . Years of education: Not on file  . Highest education level: Not on file  Occupational History  . Not on file  Social Needs  . Financial resource strain: Not on file  . Food insecurity:    Worry: Not on file    Inability: Not on file  . Transportation needs:    Medical: Not on file    Non-medical: Not on file  Tobacco Use  . Smoking status: Former Smoker    Types: Cigarettes  . Smokeless tobacco: Never Used  Substance and Sexual Activity  . Alcohol use: No  . Drug use: No  . Sexual activity: Not on file  Lifestyle  . Physical activity:    Days per week: Not on file    Minutes per session: Not on file  . Stress: Not on file  Relationships  . Social connections:    Talks on phone: Not on file    Gets together: Not on file    Attends religious service: Not on file    Active member of club or organization: Not on file    Attends meetings of clubs or organizations: Not on file    Relationship status: Not on file  Other Topics Concern  . Not on file  Social History Narrative      Used to be a Geophysicist/field seismologist   Used to smoke until 2012   Used to drink heavily a long time ago   Prior drug use   Current wife is 2nd wife-married 2007   Finished high school-did some college   Outpatient  Encounter Medications as of 12/02/2017  Medication Sig  . ALPRAZolam (XANAX) 0.5 MG tablet Take 0.5 mg by mouth 4 (four) times daily.  . Blood Glucose Monitoring Suppl (ACCU-CHEK GUIDE) w/Device KIT 1 Piece by Does not apply route as directed.  . cyclobenzaprine (FLEXERIL) 10 MG tablet Take 10 mg by mouth 3 (three) times daily as needed for muscle spasms.  . DULoxetine (CYMBALTA) 30 MG capsule Take 30 mg by mouth daily.  . furosemide (LASIX) 20 MG tablet Take 20 mg by mouth daily.  Marland Kitchen glucose blood (ACCU-CHEK GUIDE) test strip Use as instructed  . metFORMIN (GLUCOPHAGE) 500 MG tablet Take 500 mg by mouth 2 (two) times daily with a meal.  . morphine (MS CONTIN) 60 MG 12 hr tablet Take 60 mg by mouth every 12 (twelve) hours.  Marland Kitchen  Multiple Vitamin (MULTIVITAMIN WITH MINERALS) TABS Take 1 tablet by mouth daily.  . Oxycodone HCl 10 MG TABS 3 (three) times daily.  . Potassium Chloride Crys ER (KLOR-CON M20 PO) Take by mouth daily.  . simvastatin (ZOCOR) 40 MG tablet Take 40 mg by mouth daily.  . traZODone (DESYREL) 50 MG tablet Take 50 mg by mouth at bedtime.   No facility-administered encounter medications on file as of 12/02/2017.     ALLERGIES: Allergies  Allergen Reactions  . Neurontin [Gabapentin] Other (See Comments)    hallucinations    VACCINATION STATUS: Immunization History  Administered Date(s) Administered  . Influenza-Unspecified 04/05/2014    Diabetes  He presents for his follow-up diabetic visit. He has type 2 diabetes mellitus. Onset time: Patient's history that he did not know he has diabetes, however metformin was prescribed for him in November 2018, but decided not to take the metformin for unclear reasons. His disease course has been improving. There are no hypoglycemic associated symptoms. Pertinent negatives for hypoglycemia include no confusion, headaches, pallor or seizures. Pertinent negatives for diabetes include no blurred vision, no chest pain, no fatigue, no  polyphagia, no polyuria and no weakness. There are no hypoglycemic complications. Symptoms are improving. There are no diabetic complications. Risk factors for coronary artery disease include diabetes mellitus, dyslipidemia, hypertension, male sex, obesity, tobacco exposure and sedentary lifestyle. When asked about current treatments, none were reported. His weight is stable. He is following a generally unhealthy diet. When asked about meal planning, he reported none. He has not had a previous visit with a dietitian. He never participates in exercise. His home blood glucose trend is decreasing steadily. His breakfast blood glucose range is generally 130-140 mg/dl. His overall blood glucose range is 130-140 mg/dl. An ACE inhibitor/angiotensin II receptor blocker is not being taken. He does not see a podiatrist.Eye exam is not current.  Hyperlipidemia  This is a chronic problem. The current episode started more than 1 year ago. Exacerbating diseases include diabetes and obesity. Pertinent negatives include no chest pain, myalgias or shortness of breath. Current antihyperlipidemic treatment includes statins. Risk factors for coronary artery disease include diabetes mellitus, dyslipidemia, hypertension, male sex, a sedentary lifestyle and family history.  Hypertension  This is a chronic problem. The current episode started more than 1 year ago. The problem is controlled. Pertinent negatives include no blurred vision, chest pain, headaches, neck pain, palpitations or shortness of breath. Risk factors for coronary artery disease include diabetes mellitus, dyslipidemia, male gender, obesity, smoking/tobacco exposure, sedentary lifestyle and family history.      Review of Systems  Constitutional: Negative for chills, fatigue, fever and unexpected weight change.  HENT: Negative for dental problem, mouth sores and trouble swallowing.   Eyes: Negative for blurred vision and visual disturbance.  Respiratory:  Negative for cough, choking, chest tightness, shortness of breath and wheezing.   Cardiovascular: Negative for chest pain, palpitations and leg swelling.  Gastrointestinal: Negative for abdominal distention, abdominal pain, constipation, diarrhea, nausea and vomiting.  Endocrine: Negative for polyphagia and polyuria.  Genitourinary: Negative for dysuria, flank pain, hematuria and urgency.  Musculoskeletal: Positive for back pain and gait problem. Negative for myalgias and neck pain.  Skin: Negative for pallor, rash and wound.  Neurological: Negative for seizures, syncope, weakness, numbness and headaches.  Psychiatric/Behavioral: Negative for confusion and dysphoric mood.    Objective:    BP 128/81   Pulse 99   Ht '5\' 4"'  (1.626 m)   Wt 200 lb (90.7 kg)  BMI 34.33 kg/m   Wt Readings from Last 3 Encounters:  12/02/17 200 lb (90.7 kg)  08/30/17 200 lb (90.7 kg)  07/26/17 198 lb (89.8 kg)     Physical Exam  Constitutional: He is oriented to person, place, and time. He appears well-developed. He is cooperative. No distress.  Walks with a cane due to back pain  HENT:  Head: Normocephalic and atraumatic.  Eyes: EOM are normal.  Neck: Normal range of motion. Neck supple. No tracheal deviation present. No thyromegaly present.  Cardiovascular: Normal rate, S1 normal and S2 normal. Exam reveals no gallop.  No murmur heard. Pulses:      Dorsalis pedis pulses are 1+ on the right side, and 1+ on the left side.       Posterior tibial pulses are 1+ on the right side, and 1+ on the left side.  Pulmonary/Chest: Effort normal. No respiratory distress. He has no wheezes.  Abdominal: He exhibits no distension. There is no tenderness. There is no guarding and no CVA tenderness.  Musculoskeletal: He exhibits no edema.       Right shoulder: He exhibits no swelling and no deformity.  Neurological: He is alert and oriented to person, place, and time. He has normal strength. A cranial nerve deficit is  present. No sensory deficit. Gait normal.  Hard of hearing.  Skin: Skin is warm and dry. No rash noted. No cyanosis. Nails show no clubbing.  Psychiatric: His speech is normal. Cognition and memory are normal.      Recent Results (from the past 2160 hour(s))  Comprehensive metabolic panel     Status: Abnormal   Collection Time: 11/25/17  9:23 AM  Result Value Ref Range   Glucose 88 65 - 99 mg/dL   BUN 8 8 - 27 mg/dL   Creatinine, Ser 0.90 0.76 - 1.27 mg/dL   GFR calc non Af Amer 92 >59 mL/min/1.73   GFR calc Af Amer 106 >59 mL/min/1.73   BUN/Creatinine Ratio 9 (L) 10 - 24   Sodium 143 134 - 144 mmol/L   Potassium 4.4 3.5 - 5.2 mmol/L   Chloride 100 96 - 106 mmol/L   CO2 30 (H) 20 - 29 mmol/L   Calcium 9.3 8.6 - 10.2 mg/dL   Total Protein 7.3 6.0 - 8.5 g/dL   Albumin 4.0 3.6 - 4.8 g/dL   Globulin, Total 3.3 1.5 - 4.5 g/dL   Albumin/Globulin Ratio 1.2 1.2 - 2.2   Bilirubin Total 0.4 0.0 - 1.2 mg/dL   Alkaline Phosphatase 122 (H) 39 - 117 IU/L   AST 30 0 - 40 IU/L   ALT 26 0 - 44 IU/L  Hgb A1c w/o eAG     Status: Abnormal   Collection Time: 11/25/17  9:23 AM  Result Value Ref Range   Hgb A1c MFr Bld 7.2 (H) 4.8 - 5.6 %    Comment:          Prediabetes: 5.7 - 6.4          Diabetes: >6.4          Glycemic control for adults with diabetes: <7.0     Lipid Panel     Component Value Date/Time   CHOL 131 05/20/2017   TRIG 145 05/20/2017   HDL 47 05/20/2017   LDLCALC 55 05/20/2017     Assessment & Plan:   1. Uncontrolled type 2 diabetes mellitus with hyperglycemia (HCC)  - KATHRYN LINAREZ has currently uncontrolled symptomatic type 2 DM since  62 years of age. -He returns with significantly improved blood glucose profile and improved A1c of 7.2%, generally improving from 10.4%.     Recent labs reviewed, showing normal renal function.  -his diabetes is complicated by obesity/sedentary life and HAO DION remains at a high risk for more acute and chronic  complications which include CAD, CVA, CKD, retinopathy, and neuropathy. These are all discussed in detail with the patient.  - I have counseled him on diet management and weight loss, by adopting a carbohydrate restricted/protein rich diet.  -  Suggestion is made for him to avoid simple carbohydrates  from his diet including Cakes, Sweet Desserts / Pastries, Ice Cream, Soda (diet and regular), Sweet Tea, Candies, Chips, Cookies, Store Bought Juices, Alcohol in Excess of  1-2 drinks a day, Artificial Sweeteners, and "Sugar-free" Products. This will help patient to have stable blood glucose profile and potentially avoid unintended weight gain.  - I encouraged him to switch to  unprocessed or minimally processed complex starch and increased protein intake (animal or plant source), fruits, and vegetables.  - he is advised to stick to a routine mealtimes to eat 3 meals  a day and avoid unnecessary snacks ( to snack only to correct hypoglycemia).   - he been scheduled with Jearld Fenton, RDN, CDE for individualized diabetes education.  - I have approached him with the following individualized plan to manage diabetes and patient agrees:   -Based on his presentation with controlled glycemia, he will not require insulin treatment for now.  -He is advised to continue metformin 500 mg p.o. twice daily with meals-after breakfast and after supper.     -Patient is encouraged to call clinic for blood glucose levels less than 70 or above 300 mg /dl.  - he will be considered for incretin therapy as appropriate next visit. - Patient specific target  A1c;  LDL, HDL, Triglycerides, and  Waist Circumference were discussed in detail.  2) BP/HTN: His blood pressure is controlled to target at 128/81.    He is not on ACEI/ARB.  He will be considered for low-dose lisinopril if he registers above target blood pressure on subsequent visits. 3) Lipids/HPL: His recent lipid panel showed controlled LDL at 72.  He is  advised to continue simvastatin 40 mg p.o. nightly.   4)  Weight/Diet: CDE Consult has been initiated , exercise, and detailed carbohydrates information provided.  5) subclinical hyperthyroidism-resolved. Repeat thyroid function tests are within normal limits.  No intervention required.  6) Chronic Care/Health Maintenance:  -he  is on Statin medications and  is encouraged to continue to follow up with Ophthalmology, Dentist,  Podiatrist at least yearly or according to recommendations, and advised to  stay away from smoking. I have recommended yearly flu vaccine and pneumonia vaccination at least every 5 years; moderate intensity exercise for up to 150 minutes weekly; and  sleep for at least 7 hours a day.  - I advised patient to maintain close follow up with Redmond [employer], MD for primary care needs.  - Time spent with the patient: 25 min, of which >50% was spent in reviewing his blood glucose logs , discussing his hypo- and hyper-glycemic episodes, reviewing his current and  previous labs and insulin doses and developing a plan to avoid hypo- and hyper-glycemia. Please refer to Patient Instructions for Blood Glucose Monitoring and Insulin/Medications Dosing Guide"  in media tab for additional information. Victor Fields participated in the discussions, expressed understanding, and voiced agreement with the above  plans.  All questions were answered to his satisfaction. he is encouraged to contact clinic should he have any questions or concerns prior to his return visit.    Follow up plan: - Return in about 4 months (around 04/04/2018) for follow up with pre-visit labs.  Glade Lloyd, MD [facility] [address] [place], [place] [address] Phone: [phone]  Fax: [phone]    12/02/2017, 11:16 AM  This note was partially dictated with voice recognition software. Similar sounding words can be transcribed inadequately or may not  be  corrected upon review.

## 2018-01-12 DIAGNOSIS — Z0001 Encounter for general adult medical examination with abnormal findings: Secondary | ICD-10-CM | POA: Diagnosis not present

## 2018-01-12 DIAGNOSIS — J329 Chronic sinusitis, unspecified: Secondary | ICD-10-CM | POA: Diagnosis not present

## 2018-01-12 DIAGNOSIS — J449 Chronic obstructive pulmonary disease, unspecified: Secondary | ICD-10-CM | POA: Diagnosis not present

## 2018-01-12 DIAGNOSIS — E748 Other specified disorders of carbohydrate metabolism: Secondary | ICD-10-CM | POA: Diagnosis not present

## 2018-01-12 DIAGNOSIS — M2550 Pain in unspecified joint: Secondary | ICD-10-CM | POA: Diagnosis not present

## 2018-01-12 DIAGNOSIS — E782 Mixed hyperlipidemia: Secondary | ICD-10-CM | POA: Diagnosis not present

## 2018-01-12 DIAGNOSIS — L219 Seborrheic dermatitis, unspecified: Secondary | ICD-10-CM | POA: Diagnosis not present

## 2018-01-12 DIAGNOSIS — Z1389 Encounter for screening for other disorder: Secondary | ICD-10-CM | POA: Diagnosis not present

## 2018-04-04 ENCOUNTER — Other Ambulatory Visit: Payer: Self-pay | Admitting: "Endocrinology

## 2018-04-04 ENCOUNTER — Ambulatory Visit: Payer: Medicare Other | Admitting: "Endocrinology

## 2018-04-04 DIAGNOSIS — E1165 Type 2 diabetes mellitus with hyperglycemia: Secondary | ICD-10-CM | POA: Diagnosis not present

## 2018-04-05 LAB — COMPREHENSIVE METABOLIC PANEL
ALBUMIN: 3.9 g/dL (ref 3.6–4.8)
ALT: 25 IU/L (ref 0–44)
AST: 39 IU/L (ref 0–40)
Albumin/Globulin Ratio: 1.1 — ABNORMAL LOW (ref 1.2–2.2)
Alkaline Phosphatase: 108 IU/L (ref 39–117)
BILIRUBIN TOTAL: 0.5 mg/dL (ref 0.0–1.2)
BUN / CREAT RATIO: 6 — AB (ref 10–24)
BUN: 5 mg/dL — AB (ref 8–27)
CHLORIDE: 97 mmol/L (ref 96–106)
CO2: 27 mmol/L (ref 20–29)
Calcium: 9.1 mg/dL (ref 8.6–10.2)
Creatinine, Ser: 0.87 mg/dL (ref 0.76–1.27)
GFR calc Af Amer: 108 mL/min/{1.73_m2} (ref >59)
GFR calc non Af Amer: 93 mL/min/{1.73_m2} (ref >59)
GLUCOSE: 158 mg/dL — AB (ref 65–99)
Globulin, Total: 3.4 g/dL (ref 1.5–4.5)
Potassium: 3.7 mmol/L (ref 3.5–5.2)
SODIUM: 139 mmol/L (ref 134–144)
Total Protein: 7.3 g/dL (ref 6.0–8.5)

## 2018-04-05 LAB — HGB A1C W/O EAG: HEMOGLOBIN A1C: 6.5 % — AB (ref 4.8–5.6)

## 2018-04-19 DIAGNOSIS — Z1389 Encounter for screening for other disorder: Secondary | ICD-10-CM | POA: Diagnosis not present

## 2018-04-19 DIAGNOSIS — D485 Neoplasm of uncertain behavior of skin: Secondary | ICD-10-CM | POA: Diagnosis not present

## 2018-04-19 DIAGNOSIS — M1991 Primary osteoarthritis, unspecified site: Secondary | ICD-10-CM | POA: Diagnosis not present

## 2018-04-19 DIAGNOSIS — G894 Chronic pain syndrome: Secondary | ICD-10-CM | POA: Diagnosis not present

## 2018-05-02 DIAGNOSIS — H2513 Age-related nuclear cataract, bilateral: Secondary | ICD-10-CM | POA: Diagnosis not present

## 2018-05-02 DIAGNOSIS — E119 Type 2 diabetes mellitus without complications: Secondary | ICD-10-CM | POA: Diagnosis not present

## 2018-05-02 DIAGNOSIS — H18413 Arcus senilis, bilateral: Secondary | ICD-10-CM | POA: Diagnosis not present

## 2018-05-05 DIAGNOSIS — D1722 Benign lipomatous neoplasm of skin and subcutaneous tissue of left arm: Secondary | ICD-10-CM | POA: Diagnosis not present

## 2018-05-05 DIAGNOSIS — D1721 Benign lipomatous neoplasm of skin and subcutaneous tissue of right arm: Secondary | ICD-10-CM | POA: Diagnosis not present

## 2018-05-05 DIAGNOSIS — R599 Enlarged lymph nodes, unspecified: Secondary | ICD-10-CM | POA: Diagnosis not present

## 2018-06-23 DIAGNOSIS — G894 Chronic pain syndrome: Secondary | ICD-10-CM | POA: Diagnosis not present

## 2018-06-23 DIAGNOSIS — M1991 Primary osteoarthritis, unspecified site: Secondary | ICD-10-CM | POA: Diagnosis not present

## 2018-08-24 ENCOUNTER — Other Ambulatory Visit (HOSPITAL_COMMUNITY): Payer: Self-pay | Admitting: Internal Medicine

## 2018-08-24 ENCOUNTER — Ambulatory Visit (HOSPITAL_COMMUNITY)
Admission: RE | Admit: 2018-08-24 | Discharge: 2018-08-24 | Disposition: A | Discharge status: Home / Self Care | Payer: Medicare Other | Source: Ambulatory Visit | Attending: Internal Medicine | Admitting: Internal Medicine

## 2018-08-24 DIAGNOSIS — Z1389 Encounter for screening for other disorder: Secondary | ICD-10-CM | POA: Diagnosis not present

## 2018-08-24 DIAGNOSIS — M1611 Unilateral primary osteoarthritis, right hip: Secondary | ICD-10-CM | POA: Diagnosis not present

## 2018-08-24 DIAGNOSIS — M25552 Pain in left hip: Secondary | ICD-10-CM | POA: Insufficient documentation

## 2018-08-24 DIAGNOSIS — G894 Chronic pain syndrome: Secondary | ICD-10-CM | POA: Diagnosis not present

## 2018-10-25 DIAGNOSIS — G894 Chronic pain syndrome: Secondary | ICD-10-CM | POA: Diagnosis not present

## 2018-10-25 DIAGNOSIS — E7849 Other hyperlipidemia: Secondary | ICD-10-CM | POA: Diagnosis not present

## 2018-10-25 DIAGNOSIS — Z0001 Encounter for general adult medical examination with abnormal findings: Secondary | ICD-10-CM | POA: Diagnosis not present

## 2018-10-25 DIAGNOSIS — J449 Chronic obstructive pulmonary disease, unspecified: Secondary | ICD-10-CM | POA: Diagnosis not present

## 2018-10-25 DIAGNOSIS — Z1389 Encounter for screening for other disorder: Secondary | ICD-10-CM | POA: Diagnosis not present

## 2018-10-25 DIAGNOSIS — I10 Essential (primary) hypertension: Secondary | ICD-10-CM | POA: Diagnosis not present

## 2018-11-07 DIAGNOSIS — E748 Other specified disorders of carbohydrate metabolism: Secondary | ICD-10-CM | POA: Diagnosis not present

## 2018-11-07 DIAGNOSIS — E119 Type 2 diabetes mellitus without complications: Secondary | ICD-10-CM | POA: Diagnosis not present

## 2018-11-07 DIAGNOSIS — E0590 Thyrotoxicosis, unspecified without thyrotoxic crisis or storm: Secondary | ICD-10-CM | POA: Diagnosis not present

## 2018-11-07 LAB — TSH: TSH: 0.29 — AB (ref 0.41–5.90)

## 2018-11-08 LAB — HEMOGLOBIN A1C: Hemoglobin A1C: 5.9

## 2018-12-26 ENCOUNTER — Ambulatory Visit (INDEPENDENT_AMBULATORY_CARE_PROVIDER_SITE_OTHER): Payer: Medicare Other | Admitting: "Endocrinology

## 2018-12-26 ENCOUNTER — Encounter: Payer: Self-pay | Admitting: "Endocrinology

## 2018-12-26 ENCOUNTER — Encounter (INDEPENDENT_AMBULATORY_CARE_PROVIDER_SITE_OTHER): Payer: Self-pay

## 2018-12-26 ENCOUNTER — Other Ambulatory Visit: Payer: Self-pay

## 2018-12-26 VITALS — BP 128/85 | HR 105 | Ht 64.0 in | Wt 186.0 lb

## 2018-12-26 DIAGNOSIS — E119 Type 2 diabetes mellitus without complications: Secondary | ICD-10-CM | POA: Diagnosis not present

## 2018-12-26 DIAGNOSIS — E782 Mixed hyperlipidemia: Secondary | ICD-10-CM | POA: Diagnosis not present

## 2018-12-26 NOTE — Progress Notes (Signed)
Endocrinology follow-up note       12/26/2018, 4:46 PM   Subjective:    Patient ID: Victor Fields, male    DOB: Apr 16, 1956.  Victor Fields is being seen in follow-up for management of currently uncontrolled symptomatic diabetes requested by  Redmond School, MD.   Past Medical History:  Diagnosis Date  . Back pain   . Elevated cholesterol   . Enlarged prostate   . Hypertension    Past Surgical History:  Procedure Laterality Date  . crush injuries    . LUNG SURGERY     Social History   Socioeconomic History  . Marital status: Married    Spouse name: Not on file  . Number of children: Not on file  . Years of education: Not on file  . Highest education level: Not on file  Occupational History  . Not on file  Social Needs  . Financial resource strain: Not on file  . Food insecurity    Worry: Not on file    Inability: Not on file  . Transportation needs    Medical: Not on file    Non-medical: Not on file  Tobacco Use  . Smoking status: Former Smoker    Types: Cigarettes  . Smokeless tobacco: Never Used  Substance and Sexual Activity  . Alcohol use: No  . Drug use: No  . Sexual activity: Not on file  Lifestyle  . Physical activity    Days per week: Not on file    Minutes per session: Not on file  . Stress: Not on file  Relationships  . Social Herbalist on phone: Not on file    Gets together: Not on file    Attends religious service: Not on file    Active member of club or organization: Not on file    Attends meetings of clubs or organizations: Not on file    Relationship status: Not on file  Other Topics Concern  . Not on file  Social History Narrative      Used to be a Geophysicist/field seismologist   Used to smoke until 2012   Used to drink heavily a long time ago   Prior drug use   Current wife is 2nd wife-married 2007   Finished high school-did some college   Outpatient  Encounter Medications as of 12/26/2018  Medication Sig  . BUSPIRONE HCL PO Take 10 mg by mouth.  . tamsulosin (FLOMAX) 0.4 MG CAPS capsule Take 0.4 mg by mouth.  . zolpidem (AMBIEN) 10 MG tablet Take 10 mg by mouth at bedtime as needed for sleep.  Marland Kitchen ALPRAZolam (XANAX) 0.5 MG tablet Take 0.5 mg by mouth 4 (four) times daily.  . Blood Glucose Monitoring Suppl (ACCU-CHEK GUIDE) w/Device KIT 1 Piece by Does not apply route as directed.  . cyclobenzaprine (FLEXERIL) 10 MG tablet Take 10 mg by mouth 3 (three) times daily as needed for muscle spasms.  . DULoxetine (CYMBALTA) 30 MG capsule Take 30 mg by mouth daily.  . furosemide (LASIX) 20 MG tablet Take 20 mg by mouth daily.  Marland Kitchen glucose blood (ACCU-CHEK GUIDE) test strip  Use as instructed  . metFORMIN (GLUCOPHAGE) 500 MG tablet Take 500 mg by mouth daily after breakfast.  . morphine (MS CONTIN) 60 MG 12 hr tablet Take 60 mg by mouth every 12 (twelve) hours.  . Multiple Vitamin (MULTIVITAMIN WITH MINERALS) TABS Take 1 tablet by mouth daily.  . Oxycodone HCl 10 MG TABS 3 (three) times daily.  . Potassium Chloride Crys ER (KLOR-CON M20 PO) Take by mouth daily.  . simvastatin (ZOCOR) 40 MG tablet Take 40 mg by mouth daily.  . traZODone (DESYREL) 50 MG tablet Take 50 mg by mouth at bedtime.   No facility-administered encounter medications on file as of 12/26/2018.     ALLERGIES: Allergies  Allergen Reactions  . Neurontin [Gabapentin] Other (See Comments)    hallucinations    VACCINATION STATUS: Immunization History  Administered Date(s) Administered  . Influenza-Unspecified 04/05/2014    Diabetes He presents for his follow-up diabetic visit. He has type 2 diabetes mellitus. Onset time: Patient's history that he did not know he has diabetes, however metformin was prescribed for him in November 2018, but decided not to take the metformin for unclear reasons. His disease course has been improving. There are no hypoglycemic associated symptoms.  Pertinent negatives for hypoglycemia include no confusion, headaches, pallor or seizures. Pertinent negatives for diabetes include no blurred vision, no chest pain, no fatigue, no polyphagia, no polyuria and no weakness. There are no hypoglycemic complications. Symptoms are improving. There are no diabetic complications. Risk factors for coronary artery disease include diabetes mellitus, dyslipidemia, hypertension, male sex, obesity, tobacco exposure and sedentary lifestyle. When asked about current treatments, none were reported. His weight is decreasing steadily. He is following a generally unhealthy diet. When asked about meal planning, he reported none. He has not had a previous visit with a dietitian. He never participates in exercise. His home blood glucose trend is decreasing steadily. An ACE inhibitor/angiotensin II receptor blocker is not being taken. He does not see a podiatrist.Eye exam is not current.  Hyperlipidemia This is a chronic problem. The current episode started more than 1 year ago. Exacerbating diseases include diabetes and obesity. Pertinent negatives include no chest pain, myalgias or shortness of breath. Current antihyperlipidemic treatment includes statins. Risk factors for coronary artery disease include diabetes mellitus, dyslipidemia, hypertension, male sex, a sedentary lifestyle and family history.  Hypertension This is a chronic problem. The current episode started more than 1 year ago. The problem is controlled. Pertinent negatives include no blurred vision, chest pain, headaches, neck pain, palpitations or shortness of breath. Risk factors for coronary artery disease include diabetes mellitus, dyslipidemia, male gender, obesity, smoking/tobacco exposure, sedentary lifestyle and family history.      Review of Systems  Constitutional: Negative for chills, fatigue, fever and unexpected weight change.  HENT: Negative for dental problem, mouth sores and trouble swallowing.    Eyes: Negative for blurred vision and visual disturbance.  Respiratory: Negative for cough, choking, chest tightness, shortness of breath and wheezing.   Cardiovascular: Negative for chest pain, palpitations and leg swelling.  Gastrointestinal: Negative for abdominal distention, abdominal pain, constipation, diarrhea, nausea and vomiting.  Endocrine: Negative for polyphagia and polyuria.  Genitourinary: Negative for dysuria, flank pain, hematuria and urgency.  Musculoskeletal: Positive for back pain and gait problem. Negative for myalgias and neck pain.  Skin: Negative for pallor, rash and wound.  Neurological: Negative for seizures, syncope, weakness, numbness and headaches.  Psychiatric/Behavioral: Negative for confusion and dysphoric mood.    Objective:    BP  128/85 (BP Location: Right Leg, Patient Position: Sitting, Cuff Size: Normal)   Pulse (!) 105   Ht 5' 4" (1.626 m)   Wt 186 lb (84.4 kg)   SpO2 97%   BMI 31.93 kg/m   Wt Readings from Last 3 Encounters:  12/26/18 186 lb (84.4 kg)  12/02/17 200 lb (90.7 kg)  08/30/17 200 lb (90.7 kg)     Physical Exam  Constitutional: He is oriented to person, place, and time. He appears well-developed. He is cooperative. No distress.  Walks with a cane due to back pain  HENT:  Head: Normocephalic and atraumatic.  Eyes: EOM are normal.  Neck: Normal range of motion. Neck supple. No tracheal deviation present. No thyromegaly present.  Cardiovascular: Normal rate, S1 normal and S2 normal. Exam reveals no gallop.  No murmur heard. Pulses:      Dorsalis pedis pulses are 1+ on the right side and 1+ on the left side.       Posterior tibial pulses are 1+ on the right side and 1+ on the left side.  Pulmonary/Chest: Effort normal. No respiratory distress. He has no wheezes.  Abdominal: He exhibits no distension. There is no abdominal tenderness. There is no guarding and no CVA tenderness.  Musculoskeletal:        General: No edema.      Right shoulder: He exhibits no swelling and no deformity.  Neurological: He is alert and oriented to person, place, and time. He has normal strength. A cranial nerve deficit is present. No sensory deficit. Gait normal.  Hard of hearing.  Skin: Skin is warm and dry. No rash noted. No cyanosis. Nails show no clubbing.  Psychiatric: His speech is normal. Cognition and memory are normal.       Lipid Panel     Component Value Date/Time   CHOL 131 05/20/2017   TRIG 145 05/20/2017   HDL 47 05/20/2017   LDLCALC 55 05/20/2017   Recent Results (from the past 2160 hour(s))  Hemoglobin A1c     Status: None   Collection Time: 11/07/18 12:00 AM  Result Value Ref Range   Hemoglobin A1C 5.9   TSH     Status: Abnormal   Collection Time: 11/07/18 12:00 AM  Result Value Ref Range   TSH 0.29 (A) 0.41 - 5.90    Comment: tt4 7.6, tt3 131     Assessment & Plan:   1. Uncontrolled type 2 diabetes mellitus with hyperglycemia (HCC)  - OVIDIO STEELE has currently uncontrolled symptomatic type 2 DM since  63 years of age. -He returns with significantly improved blood glucose profile and improved A1c of 8.9%, generally improving from 10.4%     Recent labs reviewed, showing normal renal function.  -his diabetes is complicated by obesity/sedentary life and DAYLN TUGWELL remains at a high risk for more acute and chronic complications which include CAD, CVA, CKD, retinopathy, and neuropathy. These are all discussed in detail with the patient.  - I have counseled him on diet management and weight loss, by adopting a carbohydrate restricted/protein rich diet.  - he  admits there is a room for improvement in his diet and drink choices. -  Suggestion is made for him to avoid simple carbohydrates  from his diet including Cakes, Sweet Desserts / Pastries, Ice Cream, Soda (diet and regular), Sweet Tea, Candies, Chips, Cookies, Sweet Pastries,  Store Bought Juices, Alcohol in Excess of  1-2 drinks a day,  Artificial Sweeteners, Coffee Creamer, and "Sugar-free" Products.  This will help patient to have stable blood glucose profile and potentially avoid unintended weight gain.  - I encouraged him to switch to  unprocessed or minimally processed complex starch and increased protein intake (animal or plant source), fruits, and vegetables.  - he is advised to stick to a routine mealtimes to eat 3 meals  a day and avoid unnecessary snacks ( to snack only to correct hypoglycemia).   - he been scheduled with Jearld Fenton, RDN, CDE for individualized diabetes education.  - I have approached him with the following individualized plan to manage diabetes and patient agrees:   -Based on his presentation with diabetes with A1c of 5.9%, he is advised to lower his metformin 500 mg daily after breakfast .   - Patient specific target  A1c;  LDL, HDL, Triglycerides, and  Waist Circumference were discussed in detail.  2) BP/HTN: His blood pressure is controlled to target.    He is not on ACEI/ARB.  He will be considered for low-dose lisinopril if he registers above target blood pressure on subsequent visits. 3) Lipids/HPL: His recent lipid panel showed controlled LDL at 72.  He is advised to continue simvastatin 40 mg p.o. nightly.   4)  Weight/Diet: CDE Consult has been initiated , exercise, and detailed carbohydrates information provided.  5) subclinical hyperthyroidism- He will not need intervention with antithyroid treatment at this time.  We will have repeat thyroid function test along next labs.  6) Chronic Care/Health Maintenance:  -he  is on Statin medications and  is encouraged to continue to follow up with Ophthalmology, Dentist,  Podiatrist at least yearly or according to recommendations, and advised to  stay away from smoking. I have recommended yearly flu vaccine and pneumonia vaccination at least every 5 years; moderate intensity exercise for up to 150 minutes weekly; and  sleep for at least 7  hours a day.  - I advised patient to maintain close follow up with Redmond School, MD for primary care needs.  - Time spent with the patient: 25 min, of which >50% was spent in reviewing his blood glucose logs , discussing his hypoglycemia and hyperglycemia episodes, reviewing his current and  previous labs / studies and medications  doses and developing a plan to avoid hypoglycemia and hyperglycemia. Please refer to Patient Instructions for Blood Glucose Monitoring and Insulin/Medications Dosing Guide"  in media tab for additional information. Please  also refer to " Patient Self Inventory" in the Media  tab for reviewed elements of pertinent patient history.  Victor Fields participated in the discussions, expressed understanding, and voiced agreement with the above plans.  All questions were answered to his satisfaction. he is encouraged to contact clinic should he have any questions or concerns prior to his return visit.   Follow up plan: - Return in about 6 months (around 06/27/2019) for Follow up with Pre-visit Labs.  Glade Lloyd, MD [facility] [address] [place], [place] [address] Phone: [phone]  Fax: [phone]    12/26/2018, 4:46 PM  This note was partially dictated with voice recognition software. Similar sounding words can be transcribed inadequately or may not  be corrected upon review.

## 2018-12-26 NOTE — Patient Instructions (Signed)

## 2018-12-30 DIAGNOSIS — G894 Chronic pain syndrome: Secondary | ICD-10-CM | POA: Diagnosis not present

## 2018-12-30 DIAGNOSIS — M5412 Radiculopathy, cervical region: Secondary | ICD-10-CM | POA: Diagnosis not present

## 2018-12-30 DIAGNOSIS — E119 Type 2 diabetes mellitus without complications: Secondary | ICD-10-CM | POA: Diagnosis not present

## 2019-01-25 DIAGNOSIS — J449 Chronic obstructive pulmonary disease, unspecified: Secondary | ICD-10-CM | POA: Diagnosis not present

## 2019-01-25 DIAGNOSIS — M1991 Primary osteoarthritis, unspecified site: Secondary | ICD-10-CM | POA: Diagnosis not present

## 2019-01-25 DIAGNOSIS — G894 Chronic pain syndrome: Secondary | ICD-10-CM | POA: Diagnosis not present

## 2019-02-27 DIAGNOSIS — M2550 Pain in unspecified joint: Secondary | ICD-10-CM | POA: Diagnosis not present

## 2019-02-27 DIAGNOSIS — G894 Chronic pain syndrome: Secondary | ICD-10-CM | POA: Diagnosis not present

## 2019-03-18 ENCOUNTER — Other Ambulatory Visit: Payer: Self-pay

## 2019-03-18 ENCOUNTER — Emergency Department (EMERGENCY_DEPARTMENT_HOSPITAL)
Admission: EM | Admit: 2019-03-18 | Discharge: 2019-03-20 | Disposition: A | Discharge status: Other Acute Inpatient Hospital | Payer: Medicare Other | Source: Home / Self Care | Attending: Emergency Medicine | Admitting: Emergency Medicine

## 2019-03-18 ENCOUNTER — Encounter (HOSPITAL_COMMUNITY): Payer: Self-pay

## 2019-03-18 ENCOUNTER — Ambulatory Visit (HOSPITAL_COMMUNITY)
Admission: RE | Admit: 2019-03-18 | Discharge: 2019-03-18 | Disposition: A | Discharge status: Home / Self Care | Payer: Medicare Other | Source: Home / Self Care | Attending: Psychiatry | Admitting: Psychiatry

## 2019-03-18 DIAGNOSIS — Z03818 Encounter for observation for suspected exposure to other biological agents ruled out: Secondary | ICD-10-CM | POA: Diagnosis not present

## 2019-03-18 DIAGNOSIS — Z79899 Other long term (current) drug therapy: Secondary | ICD-10-CM | POA: Insufficient documentation

## 2019-03-18 DIAGNOSIS — R45851 Suicidal ideations: Secondary | ICD-10-CM | POA: Insufficient documentation

## 2019-03-18 DIAGNOSIS — Z87891 Personal history of nicotine dependence: Secondary | ICD-10-CM | POA: Insufficient documentation

## 2019-03-18 DIAGNOSIS — F259 Schizoaffective disorder, unspecified: Secondary | ICD-10-CM | POA: Diagnosis not present

## 2019-03-18 DIAGNOSIS — E119 Type 2 diabetes mellitus without complications: Secondary | ICD-10-CM | POA: Insufficient documentation

## 2019-03-18 DIAGNOSIS — F339 Major depressive disorder, recurrent, unspecified: Secondary | ICD-10-CM

## 2019-03-18 DIAGNOSIS — R4689 Other symptoms and signs involving appearance and behavior: Secondary | ICD-10-CM | POA: Insufficient documentation

## 2019-03-18 DIAGNOSIS — I10 Essential (primary) hypertension: Secondary | ICD-10-CM | POA: Insufficient documentation

## 2019-03-18 DIAGNOSIS — Z20828 Contact with and (suspected) exposure to other viral communicable diseases: Secondary | ICD-10-CM | POA: Insufficient documentation

## 2019-03-18 DIAGNOSIS — R45850 Homicidal ideations: Secondary | ICD-10-CM | POA: Diagnosis not present

## 2019-03-18 DIAGNOSIS — Z0389 Encounter for observation for other suspected diseases and conditions ruled out: Secondary | ICD-10-CM | POA: Insufficient documentation

## 2019-03-18 DIAGNOSIS — F919 Conduct disorder, unspecified: Secondary | ICD-10-CM | POA: Insufficient documentation

## 2019-03-18 DIAGNOSIS — Z7984 Long term (current) use of oral hypoglycemic drugs: Secondary | ICD-10-CM | POA: Insufficient documentation

## 2019-03-18 LAB — COMPREHENSIVE METABOLIC PANEL
ALT: 24 U/L (ref 0–44)
AST: 36 U/L (ref 15–41)
Albumin: 4 g/dL (ref 3.5–5.0)
Alkaline Phosphatase: 114 U/L (ref 38–126)
Anion gap: 10 (ref 5–15)
BUN: 5 mg/dL — ABNORMAL LOW (ref 8–23)
CO2: 27 mmol/L (ref 22–32)
Calcium: 9.8 mg/dL (ref 8.9–10.3)
Chloride: 102 mmol/L (ref 98–111)
Creatinine, Ser: 1 mg/dL (ref 0.61–1.24)
GFR calc Af Amer: >60 mL/min (ref >60)
GFR calc non Af Amer: >60 mL/min (ref >60)
Glucose, Bld: 120 mg/dL — ABNORMAL HIGH (ref 70–99)
Potassium: 3.9 mmol/L (ref 3.5–5.1)
Sodium: 139 mmol/L (ref 135–145)
Total Bilirubin: 0.5 mg/dL (ref 0.3–1.2)
Total Protein: 8.1 g/dL (ref 6.5–8.1)

## 2019-03-18 LAB — CBC
HCT: 46.2 % (ref 39.0–52.0)
Hemoglobin: 15.3 g/dL (ref 13.0–17.0)
MCH: 30.7 pg (ref 26.0–34.0)
MCHC: 33.1 g/dL (ref 30.0–36.0)
MCV: 92.6 fL (ref 80.0–100.0)
Platelets: 153 10*3/uL (ref 150–400)
RBC: 4.99 MIL/uL (ref 4.22–5.81)
RDW: 13.2 % (ref 11.5–15.5)
WBC: 12.2 10*3/uL — ABNORMAL HIGH (ref 4.0–10.5)
nRBC: 0 % (ref 0.0–0.2)

## 2019-03-18 LAB — ETHANOL: Alcohol, Ethyl (B): <10 mg/dL (ref <10)

## 2019-03-18 MED ORDER — ALUM & MAG HYDROXIDE-SIMETH 200-200-20 MG/5ML PO SUSP: 30.0000 mL | Freq: Four times a day (QID) | ORAL | Status: DC | PRN

## 2019-03-18 MED ORDER — ONDANSETRON HCL 4 MG PO TABS: 4.0000 mg | ORAL_TABLET | Freq: Three times a day (TID) | ORAL | Status: DC | PRN

## 2019-03-18 MED ORDER — ACETAMINOPHEN 325 MG PO TABS
650.0000 mg | ORAL_TABLET | ORAL | Status: DC | PRN
  Administered 2019-03-19 – 2019-03-20 (×3): 650 mg via ORAL
  Filled 2019-03-18 (×4): qty 2

## 2019-03-18 MED ORDER — ZOLPIDEM TARTRATE 5 MG PO TABS
5.0000 mg | ORAL_TABLET | Freq: Every evening | ORAL | Status: DC | PRN
  Administered 2019-03-19: 01:00:00 5 mg via ORAL
  Filled 2019-03-18: qty 1

## 2019-03-18 NOTE — BH Assessment (Signed)
Per Corene Cornea, NP - patient meets inpatient psychiatric hospitalization.  CSW will seek placement.

## 2019-03-18 NOTE — ED Provider Notes (Signed)
[place] DEPT Provider Note   CSN: [id number] Arrival date & time: 03/18/19  2124     History   Chief Complaint Chief Complaint  Patient presents with  . Psychiatric Evaluation    HPI Victor Fields is a 63 y.o. male with a hx of hypercholesterolemia, HTN, T2DM, & chronic back pain who presents to the ED for medical clearance for psychiatric evaluation.  Patient states that he had an episode tonight where he had an extreme outburst of anger, he states that he was shouting, throwing things, and threatened to kill himself as well as his wife.  He does not know why he said these things, he felt as though he was watching this occur as an out of body experience.  He is extremely upset and does not know what triggered this episode, he states that they were having a fairly normal conversation.  No alleviating or aggravating factors.  No specific plan reported to me.  He does have access to guns at home.  He denies current thoughts of suicide or homicide.  Asked about hallucinations patient states "I have been fighting with the devil for years".  Denies chest pain, dyspnea, abdominal pain, numbness, weakness, headache, or dizziness.  Of note patient states that he was previously taking duloxetine as well as buspirone.  He stopped the duloxetine 2 weeks ago and stopped the buspirone 2 days ago each without tapering or discussion with a provider.       HPI  Past Medical History:  Diagnosis Date  . Back pain   . Elevated cholesterol   . Enlarged prostate   . Hypertension     Patient Active Problem List   Diagnosis Date Noted  . Vitamin D deficiency 12/02/2017  . Class 1 obesity due to excess calories with serious comorbidity and body mass index (BMI) of 34.0 to 34.9 in adult 08/30/2017  . Uncontrolled type 2 diabetes mellitus with hyperglycemia ([place]) 07/26/2017  . Subclinical hyperthyroidism 07/26/2017  . Mixed hyperlipidemia 07/26/2017  . Respiratory  failure, acute ([place]) 08/24/2014  . Toxic metabolic encephalopathy-possibly iatrogenic/possibly secondary to COPD 08/24/2014  . Chronic pain syndrome-secondary to lumbar disc disease 08/24/2014  . Metabolic syndrome X 58/85/0277  . Sinus tachycardia 08/24/2014  . Diverticulosis, h/o 08/24/2014  . Acute respiratory acidosis 08/24/2014    Past Surgical History:  Procedure Laterality Date  . crush injuries    . LUNG SURGERY          Home Medications    Prior to Admission medications   Medication Sig Start Date End Date Taking? Authorizing Provider  ALPRAZolam Duanne Moron) 0.5 MG tablet Take 0.5 mg by mouth 4 (four) times daily.    [provider]  Blood Glucose Monitoring Suppl (ACCU-CHEK GUIDE) w/Device KIT 1 Piece by Does not apply route as directed. 07/26/17   Cassandria Anger, MD  BUSPIRONE HCL PO Take 10 mg by mouth.    [provider]  cyclobenzaprine (FLEXERIL) 10 MG tablet Take 10 mg by mouth 3 (three) times daily as needed for muscle spasms.    [provider]  DULoxetine (CYMBALTA) 30 MG capsule Take 30 mg by mouth daily.    [provider]  furosemide (LASIX) 20 MG tablet Take 20 mg by mouth daily.    [provider]  glucose blood (ACCU-CHEK GUIDE) test strip Use as instructed 07/26/17   Cassandria Anger, MD  metFORMIN (GLUCOPHAGE) 500 MG tablet Take 500 mg by mouth daily after breakfast.  [provider]  morphine (MS CONTIN) 60 MG 12 hr tablet Take 60 mg by mouth every 12 (twelve) hours.    [provider]  Multiple Vitamin (MULTIVITAMIN WITH MINERALS) TABS Take 1 tablet by mouth daily.    [provider]  Oxycodone HCl 10 MG TABS 3 (three) times daily. 08/02/17   [provider]  Potassium Chloride Crys ER (KLOR-CON M20 PO) Take by mouth daily.    [provider]  simvastatin (ZOCOR) 40 MG tablet Take 40 mg by mouth daily.    [provider]  tamsulosin (FLOMAX) 0.4 MG  CAPS capsule Take 0.4 mg by mouth.    [provider]  traZODone (DESYREL) 50 MG tablet Take 50 mg by mouth at bedtime.    [provider]  zolpidem (AMBIEN) 10 MG tablet Take 10 mg by mouth at bedtime as needed for sleep.    [provider]    Family History Family History  Problem Relation Age of Onset  . CAD Father   . Cancer Mother     Social History Social History   Tobacco Use  . Smoking status: Former Smoker    Types: Cigarettes  . Smokeless tobacco: Never Used  Substance Use Topics  . Alcohol use: No  . Drug use: No     Allergies   Neurontin [gabapentin]   Review of Systems Review of Systems  Constitutional: Negative for chills and fever.  Eyes: Negative for visual disturbance.  Respiratory: Negative for shortness of breath.   Cardiovascular: Negative for chest pain.  Gastrointestinal: Negative for abdominal pain, diarrhea and vomiting.  Neurological: Negative for seizures, syncope, weakness, numbness and headaches.  Psychiatric/Behavioral: Positive for hallucinations and suicidal ideas.       Positive for HI.   All other systems reviewed and are negative.    Physical Exam Updated Vital Signs BP (!) 159/117 (BP Location: Left Arm)   Pulse (!) 110   Temp 98.1 F (36.7 C) (Oral)   Resp 18   Ht _0  (1.626 m)   Wt 83.9 kg   SpO2 97%   BMI 31.76 kg/m   Physical Exam Vitals signs and nursing note reviewed.  Constitutional:      General: He is not in acute distress.    Appearance: He is well-developed. He is not toxic-appearing.  HENT:     Head: Normocephalic and atraumatic.  Eyes:     General:        Right eye: No discharge.        Left eye: No discharge.     Conjunctiva/sclera: Conjunctivae normal.  Neck:     Musculoskeletal: Neck supple.  Cardiovascular:     Rate and Rhythm: Normal rate and regular rhythm.  Pulmonary:     Effort: Pulmonary effort is normal. No respiratory distress.     Breath sounds: Normal  breath sounds. No wheezing, rhonchi or rales.  Abdominal:     General: There is no distension.     Palpations: Abdomen is soft.     Tenderness: There is no abdominal tenderness.  Skin:    General: Skin is warm and dry.     Findings: No rash.  Neurological:     Mental Status: He is alert.     Comments: Clear speech.   Psychiatric:     Comments: Patient reports episode of threats of suicide and homicide, no active thoughts currently or specific plan.  He appears upset, tearful at times.    ED Treatments /  Results  Labs (all labs ordered are listed, but only abnormal results are displayed) Labs Reviewed  COMPREHENSIVE METABOLIC PANEL  ETHANOL  CBC  RAPID URINE DRUG SCREEN, HOSP PERFORMED    EKG None  Radiology No results found.  Procedures Procedures (including critical care time)  Medications Ordered in ED Medications - No data to display   Initial Impression / Assessment and Plan / ED Course  I have reviewed the triage vital signs and the nursing notes.  Pertinent labs & imaging results that were available during my care of the patient were reviewed by me and considered in my medical decision making (see chart for details).   Patient presents to the emergency department for medical clearance after an outburst with aggressive behavior and thoughts of threats of suicide/homicide.  Also reports "fighting with the devil for years".  He was seen at [facility], sent to the ED for medical clearance.  Nontoxic-appearing, vitals with elevated blood pressure, doubt HTN emergency, tachycardia normalized on my exam.  Screening labs reviewed and fairly unremarkable.  Mild leukocytosis felt to be nonspecific.  Mild hyperglycemia without acidosis or anion gap elevation.  COVID negative.   Patient medically cleared.  Patient meets inpatient criteria per [facility], pending placement.   Final Clinical Impressions(s) / ED Diagnoses   Final diagnoses:  Aggressive behavior    ED Discharge Orders     None       Amaryllis Dyke, PA-C 03/19/19 1164    Blanchie Dessert, MD 03/19/19 2045

## 2019-03-18 NOTE — ED Triage Notes (Signed)
Pt reports that he "blew up" on his wife today. He states that he does not know what happened and has no history of this. States that he threatened to kill his wife and himself. He states that he does not want that or feel that way. States that he has been trying to stop a lot of his prescription medication and thinks he may have done it too quickly. Pt is distraught.

## 2019-03-18 NOTE — H&P (Signed)
Behavioral Health Medical Screening Exam  Victor Fields is an 63 y.o. male.  Total Time spent with patient: 20 minutes  Psychiatric Specialty Exam: Physical Exam  Constitutional: He is oriented to person, place, and time. He appears well-developed and well-nourished. No distress.  HENT:  Head: Normocephalic.  Right Ear: External ear normal.  Left Ear: External ear normal.  Eyes: Pupils are equal, round, and reactive to light. Right eye exhibits no discharge. Left eye exhibits no discharge.  Respiratory: Effort normal. No respiratory distress.  Musculoskeletal: Normal range of motion.  Neurological: He is alert and oriented to person, place, and time.  Skin: He is not diaphoretic.  Psychiatric: His mood appears anxious. His speech is rapid and/or pressured. He is not withdrawn and not actively hallucinating. Thought content is not paranoid and not delusional. He expresses impulsivity and inappropriate judgment. He exhibits a depressed mood. He expresses no homicidal and no suicidal ideation.    Review of Systems  Constitutional: Negative for chills, diaphoresis, fever, malaise/fatigue and weight loss.  Respiratory: Positive for shortness of breath. Negative for cough.   Cardiovascular: Negative for chest pain.  Gastrointestinal: Negative for diarrhea, nausea and vomiting.  Psychiatric/Behavioral: Positive for depression and suicidal ideas. Negative for hallucinations, memory loss and substance abuse. The patient is nervous/anxious and has insomnia.     There were no vitals taken for this visit.There is no height or weight on file to calculate BMI.  General Appearance: Casual and Fairly Groomed  Eye Contact:  Fair  Speech:  Pressured  Volume:  Increased  Mood:  Anxious, Depressed, Hopeless and Worthless  Affect:  Congruent, Depressed and Tearful  Thought Process:  Coherent, Goal Directed and Descriptions of Associations: Intact  Orientation:  Full (Time, Place, and Person)   Thought Content:  Logical and Hallucinations: None  Suicidal Thoughts:  Denies currently, reports that he threatened to shoot himself and his wife today. Has several guns.  Homicidal Thoughts:  Denies currently, reports that he threatened to shoot himself and his wife today. Has several guns  Memory:  Immediate;   Fair  Judgement:  Impaired  Insight:  Lacking  Psychomotor Activity:  Increased  Concentration: Concentration: Fair  Recall:  Good  Fund of Knowledge:Good  Language: Good  Akathisia:  Negative  Handed:  Right  AIMS (if indicated):     Assets:  Communication Skills Desire for Improvement Financial Resources/Insurance Housing Intimacy Leisure Time Physical Health  Sleep:       Musculoskeletal: Strength & Muscle Tone: within normal limits Gait & Station: normal Patient leans: N/A  There were no vitals taken for this visit.  Recommendations:  Based on my evaluation the patient does not appear to have an emergency medical condition.     Rozetta Nunnery, NP 03/18/2019, 10:21 PM

## 2019-03-18 NOTE — BH Assessment (Addendum)
Tele Assessment Note   Patient Name: Victor Fields MRN: [id number]  Location of Patient: [facility] SERVICE Location of Provider: [place] Department  Patient is a 63 year old male.  Patient is a walk in at [facility].  Patient was accompanied by his daughter.   Patient reports that earlier today he, "flipped out shouted at his wife and threatened to kill her and then himself"  Patient reports that he has did have access to his guns.  His daughter reports that she will take all of the guns out of his home.  Patient reports a prior suicide attempt and inpatient psychiatric hospitalization in 1979.  Patient does not remember the name of the facility.   Patient reports that he has been taking himself off of his pain meds and psychiatric medication.  Patient could not remember the name of his medications that he stopped taking.  Patient reports that he has been taking his pain meds for over 15 years.   Patient reports that he is very, "spiritual and he has been battling the devil for years".  Patient denies hearing voices telling him to harm himself or others.        Diagnosis: Schizoaffective   Past Medical History:  Past Medical History:  Diagnosis Date  . Back pain   . Elevated cholesterol   . Enlarged prostate   . Hypertension     Past Surgical History:  Procedure Laterality Date  . crush injuries    . LUNG SURGERY      Family History:  Family History  Problem Relation Age of Onset  . CAD Father   . Cancer Mother     Social History:  reports that he has quit smoking. His smoking use included cigarettes. He has never used smokeless tobacco. He reports that he does not drink alcohol or use drugs.  Additional Social History:  Alcohol / Drug Use History of alcohol / drug use?: No history of alcohol / drug abuse  CIWA:   COWS:    Allergies:  Allergies  Allergen Reactions  . Neurontin [Gabapentin] Other (See Comments)    hallucinations    Home  Medications: (Not in a hospital admission)   OB/GYN Status:  No LMP for male patient.  General Assessment Data Location of Assessment: [facility] Assessment Services TTS Assessment: In system Is this a Tele or Face-to-Face Assessment?: Tele Assessment Is this an Initial Assessment or a Re-assessment for this encounter?: Initial Assessment Patient Accompanied by:: Other Language Other than English: No(Daughtrr) Living Arrangements: Other (Comment) What gender do you identify as?: Male Marital status: Married [place] name: na Pregnancy Status: (NA) Living Arrangements: (Lives with his wife) Can pt return to current living arrangement?: Yes Admission Status: Voluntary Is patient capable of signing voluntary admission?: Yes Referral Source: Self/Family/Friend Insurance type: Medicaid  Medical Screening Exam ([place]) Medical Exam completed: Yes  Crisis Care Plan Living Arrangements: (Lives with his wife) Legal Guardian: (Self )  Education Status Is patient currently in school?: No Is the patient employed, unemployed or receiving disability?: Receiving disability income  Risk to self with the past 6 months Suicidal Ideation: Yes-Currently Present Has patient been a risk to self within the past 6 months prior to admission? : Yes Suicidal Intent: Yes-Currently Present Has patient had any suicidal intent within the past 6 months prior to admission? : Yes Is patient at risk for suicide?: Yes Suicidal Plan?: Yes-Currently Present Has patient had any suicidal plan within the past 6 months prior to  admission? : Yes Specify Current Suicidal Plan: y Access to Means: Yes Specify Access to Suicidal Means: Shot himself What has been your use of drugs/alcohol within the last 12 months?: None Reported Previous Attempts/Gestures: Yes How many times?: 1 Other Self Harm Risks: n Triggers for Past Attempts: Unpredictable Intentional Self Injurious Behavior: None Family Suicide History:  No Recent stressful life event(s): Conflict (Comment) Persecutory voices/beliefs?: No(off meds) Depression: Yes Depression Symptoms: Despondent, Insomnia, Tearfulness, Isolating, Fatigue, Guilt, Loss of interest in usual pleasures, Feeling worthless/self pity, Feeling angry/irritable Substance abuse history and/or treatment for substance abuse?: No Suicide prevention information given to non-admitted patients: Yes  Risk to Others within the past 6 months Homicidal Ideation: Yes-Currently Present Does patient have any lifetime risk of violence toward others beyond the six months prior to admission? : Yes (comment) Thoughts of Harm to Others: Yes-Currently Present Comment - Thoughts of Harm to Others: y Current Homicidal Intent: Yes-Currently Present Current Homicidal Plan: Yes-Currently Present Describe Current Homicidal Plan: y Access to Homicidal Means: Yes Describe Access to Homicidal Means: plan to shot his wife Identified Victim: wife History of harm to others?: No Assessment of Violence: None Noted Violent Behavior Description: None Noted Does patient have access to weapons?: No Criminal Charges Pending?: No Does patient have a court date: No Is patient on probation?: No  Psychosis Hallucinations: None noted Delusions: Grandiose  Mental Status Report Appearance/Hygiene: Body odor, Disheveled Eye Contact: Fair Motor Activity: Freedom of movement Speech: Logical/coherent Level of Consciousness: Alert Mood: Depressed, Suspicious, Despair, Empty Affect: Anxious, Blunted, Depressed, Frightened Anxiety Level: Moderate Thought Processes: Flight of Ideas Judgement: Unimpaired Orientation: Person, Place, Time, Situation Obsessive Compulsive Thoughts/Behaviors: None  Cognitive Functioning Concentration: Decreased Memory: Recent Intact, Remote Intact Is patient IDD: No Insight: Poor Impulse Control: Poor Appetite: Poor Have you had any weight changes? : No Change Sleep:  Decreased Total Hours of Sleep: 4 Vegetative Symptoms: Staying in bed, Decreased grooming  ADLScreening [facility] Assessment Services) Patient's cognitive ability adequate to safely complete daily activities?: Yes Patient able to express need for assistance with ADLs?: No Independently performs ADLs?: Yes (appropriate for developmental age)  Prior Inpatient Therapy Prior Inpatient Therapy: Yes Prior Therapy Dates: 1979 Prior Therapy Facilty/Provider(s): Unable to remember the name Reason for Treatment: SI  Prior Outpatient Therapy Prior Outpatient Therapy: Yes Prior Therapy Dates: 2010 Prior Therapy Facilty/Provider(s): Psychiatrist Reason for Treatment: Medication Management  Does patient have an ACCT team?: No Does patient have Intensive In-House Services?  : No Does patient have [facility] services? : No Does patient have P4CC services?: No  ADL Screening (condition at time of admission) Patient's cognitive ability adequate to safely complete daily activities?: Yes Is the patient deaf or have difficulty hearing?: No Does the patient have difficulty seeing, even when wearing glasses/contacts?: No Does the patient have difficulty concentrating, remembering, or making decisions?: No Patient able to express need for assistance with ADLs?: No Does the patient have difficulty dressing or bathing?: No Independently performs ADLs?: Yes (appropriate for developmental age) Does the patient have difficulty walking or climbing stairs?: Yes Weakness of Legs: Both Weakness of Arms/Hands: None  Home Assistive Devices/Equipment Home Assistive Devices/Equipment: Cane (specify quad or straight)    Abuse/Neglect Assessment (Assessment to be complete while patient is alone) Abuse/Neglect Assessment Can Be Completed: (None Reported) Values / Beliefs Cultural Requests During Hospitalization: None Spiritual Requests During Hospitalization: None Consults Spiritual Care Consult Needed: No Social  Work Consult Needed: No Regulatory affairs officer (For Healthcare) Does Patient Have a Medical Advance  Directive?: No Would patient like information on creating a medical advance directive?: No - Patient declined          Disposition: Per Corene Cornea, NP - patient meets inpatient psychiatric hospitalization. Disposition Initial Assessment Completed for this Encounter: Yes Disposition of Patient: Admit Type of inpatient treatment program: Adult  This service was provided via telemedicine using a 2-way, interactive audio and video technology.  Names of all persons participating in this telemedicine service and their role in this encounter. Name: Graciella Freer  Role: NA, [place]  Name: Sharene Butters  Role: Patient          Rene Paci 03/18/2019 9:58 PM

## 2019-03-19 ENCOUNTER — Other Ambulatory Visit: Payer: Self-pay

## 2019-03-19 DIAGNOSIS — F332 Major depressive disorder, recurrent severe without psychotic features: Secondary | ICD-10-CM | POA: Diagnosis not present

## 2019-03-19 DIAGNOSIS — F339 Major depressive disorder, recurrent, unspecified: Secondary | ICD-10-CM

## 2019-03-19 LAB — RAPID URINE DRUG SCREEN, HOSP PERFORMED
Amphetamines: NOT DETECTED
Barbiturates: NOT DETECTED
Benzodiazepines: POSITIVE — AB
Cocaine: NOT DETECTED
Opiates: POSITIVE — AB
Tetrahydrocannabinol: POSITIVE — AB

## 2019-03-19 LAB — SARS CORONAVIRUS 2 BY RT PCR (HOSPITAL ORDER, PERFORMED IN ~~LOC~~ HOSPITAL LAB): SARS Coronavirus 2: NEGATIVE

## 2019-03-19 MED ORDER — TRAZODONE HCL 50 MG PO TABS
50.0000 mg | ORAL_TABLET | Freq: Every evening | ORAL | Status: DC | PRN
  Administered 2019-03-19: 22:00:00 50 mg via ORAL
  Filled 2019-03-19: qty 1

## 2019-03-19 MED ORDER — BUPROPION HCL ER (XL) 150 MG PO TB24
150.0000 mg | ORAL_TABLET | Freq: Every day | ORAL | Status: DC
  Administered 2019-03-19 – 2019-03-20 (×2): 150 mg via ORAL
  Filled 2019-03-19 (×2): qty 1

## 2019-03-19 MED ORDER — TAMSULOSIN HCL 0.4 MG PO CAPS
0.4000 mg | ORAL_CAPSULE | Freq: Every evening | ORAL | Status: DC
  Administered 2019-03-19 – 2019-03-20 (×2): 0.4 mg via ORAL
  Filled 2019-03-19 (×2): qty 1

## 2019-03-19 MED ORDER — DULOXETINE HCL 30 MG PO CPEP
30.0000 mg | ORAL_CAPSULE | Freq: Every day | ORAL | Status: DC
  Administered 2019-03-19: 09:00:00 30 mg via ORAL
  Filled 2019-03-19: qty 1

## 2019-03-19 MED ORDER — OXYCODONE HCL 5 MG PO TABS
15.0000 mg | ORAL_TABLET | Freq: Three times a day (TID) | ORAL | Status: DC | PRN
  Administered 2019-03-19: 15 mg via ORAL
  Filled 2019-03-19: qty 3

## 2019-03-19 MED ORDER — METFORMIN HCL 500 MG PO TABS: 500.0000 mg | ORAL_TABLET | Freq: Two times a day (BID) | ORAL | Status: DC

## 2019-03-19 MED ORDER — SIMVASTATIN 40 MG PO TABS
40.0000 mg | ORAL_TABLET | Freq: Every evening | ORAL | Status: DC
  Administered 2019-03-19 – 2019-03-20 (×2): 40 mg via ORAL
  Filled 2019-03-19 (×2): qty 1

## 2019-03-19 MED ORDER — OXYCODONE HCL 5 MG PO TABS
15.0000 mg | ORAL_TABLET | Freq: Four times a day (QID) | ORAL | Status: DC | PRN
  Administered 2019-03-19 – 2019-03-20 (×3): 15 mg via ORAL
  Filled 2019-03-19 (×3): qty 3

## 2019-03-19 NOTE — Progress Notes (Signed)
03/19/2019  0840  Urine sent to main lab.

## 2019-03-19 NOTE — Progress Notes (Signed)
03/19/2019  1128  Patient attempted to call wife [phone]. As soon as she heard his voice she hung up on patient. The CNA attempted to call her back and the call went to voicemail.

## 2019-03-19 NOTE — Progress Notes (Signed)
03/19/2019  1242  Spoke with pharmacy and they will reach out to [facility] in [place] to see if they can find out what BP medicine pt took in the past.

## 2019-03-19 NOTE — Progress Notes (Signed)
03/19/2019  1805 Sent message to Dr Alvino Chapel regarding pt's BP 176/122 recheck 170/121.Waiting for response. Will continue to monitor.

## 2019-03-19 NOTE — Progress Notes (Signed)
This patient continues to meet inpatient criteria. CSW faxed information to the following facilities:   [facility]- no bed availability today, will review [facility]- potential bed availability today, will review [facility]  [facility] reviewing for appropriate bed availability.  Stephanie Acre, LCSW-A Clinical Social Worker

## 2019-03-19 NOTE — Progress Notes (Signed)
03/19/2019  1209 sent DrLittle message regarding pt's BP 176/103 Lt arm, recheck 146/102 Rt arm. Waiting for response. Will continue to monitor.

## 2019-03-19 NOTE — Consult Note (Addendum)
Telepsych Consultation   Reason for Consult:  Aggressive behavior Referring Physician:  EDP Location of Patient:  Location of Provider: [facility]  Patient Identification: Victor Fields MRN:  [id number] Principal Diagnosis: Major depressive disorder, recurrent episode ([place]) Diagnosis:  Principal Problem:   Major depressive disorder, recurrent episode ([place])   Total Time spent with patient: 30 minutes  Subjective:   Victor Fields is a 63 y.o. male patient admitted with aggressive behavior and anger outburst at home.  HPI:  Pt was seen and chart reviewed with treatment team and Dr Darleene Cleaver. Pt denies suicidal/homicidal ideation, denies auditory/visual hallucinations and does not appear to be responding to internal stimuli. Pt stated he knows what happened yesterday but he does not know why it happened. He stated he has never had an outburst like that before. He stopped taking his Buspar and Cymbalta 2-3 weeks ago because he felt they were making his mind and body slow.  He is under the care of Dr Redmond School, who is prescribing his antidepressant/anxiety medication. Dr Gerarda Fraction, according to [facility] lookup, is also prescribing Oxycodone Hcl 15 mg 120 pills for 20 days and Ambien 10 mg at bedtime. Pt stated he has chronic pain syndrome.  Pt appears confused today and is having difficulty processing questions. He stated his hands and legs feel jerky and restless. He was restarted on Cymbalta in the emergency room. Today, this was discontinued and Wellbutrin XL 150 mg PO started. Pt is suspected to be having some symptoms of serotonin syndrome due to his reports of jery, restless movements and being unable to sleep.  Serotonin syndrome could possibly be caused by the concurrent use of an SSRI (Cymbalta), Buspar, Ambien and Opiates. Pt is worried his wife will not allow him to come home after what he threatened to kill her.  Pt was provided support and encouragement to talk with his  family to ease his fear. Pt also stated, on admission, that he had access to weapons but today stated his son has secured all the weapons.   Pt is recommended for an inpatient hospitalization for stabilization. TTS to seek placement.   Past Psychiatric History: As above  Risk to Self:   Risk to Others:   Prior Inpatient Therapy:   Prior Outpatient Therapy:    Past Medical History:  Past Medical History:  Diagnosis Date  . Back pain   . Elevated cholesterol   . Enlarged prostate   . Hypertension     Past Surgical History:  Procedure Laterality Date  . crush injuries    . LUNG SURGERY     Family History:  Family History  Problem Relation Age of Onset  . CAD Father   . Cancer Mother    Family Psychiatric  History: Pt did not give this information Social History:  Social History   Substance and Sexual Activity  Alcohol Use No     Social History   Substance and Sexual Activity  Drug Use No    Social History   Socioeconomic History  . Marital status: Married    Spouse name: Not on file  . Number of children: Not on file  . Years of education: Not on file  . Highest education level: Not on file  Occupational History  . Not on file  Social Needs  . Financial resource strain: Not on file  . Food insecurity    Worry: Not on file    Inability: Not on file  . Transportation needs  Medical: Not on file    Non-medical: Not on file  Tobacco Use  . Smoking status: Former Smoker    Types: Cigarettes  . Smokeless tobacco: Never Used  Substance and Sexual Activity  . Alcohol use: No  . Drug use: No  . Sexual activity: Not on file  Lifestyle  . Physical activity    Days per week: Not on file    Minutes per session: Not on file  . Stress: Not on file  Relationships  . Social Herbalist on phone: Not on file    Gets together: Not on file    Attends religious service: Not on file    Active member of club or organization: Not on file    Attends  meetings of clubs or organizations: Not on file    Relationship status: Not on file  Other Topics Concern  . Not on file  Social History Narrative      Used to be a Geophysicist/field seismologist   Used to smoke until 2012   Used to drink heavily a long time ago   Prior drug use   Current wife is 2nd wife-married 2007   Finished high school-did some college   Additional Social History:    Allergies:   Allergies  Allergen Reactions  . Neurontin [Gabapentin] Other (See Comments)    hallucinations    Labs:  Results for orders placed or performed during the hospital encounter of 03/18/19 (from the past 48 hour(s))  Comprehensive metabolic panel     Status: Abnormal   Collection Time: 03/18/19  9:53 PM  Result Value Ref Range   Sodium 139 135 - 145 mmol/L   Potassium 3.9 3.5 - 5.1 mmol/L   Chloride 102 98 - 111 mmol/L   CO2 27 22 - 32 mmol/L   Glucose, Bld 120 (H) 70 - 99 mg/dL   BUN 5 (L) 8 - 23 mg/dL   Creatinine, Ser 1.00 0.61 - 1.24 mg/dL   Calcium 9.8 8.9 - 10.3 mg/dL   Total Protein 8.1 6.5 - 8.1 g/dL   Albumin 4.0 3.5 - 5.0 g/dL   AST 36 15 - 41 U/L   ALT 24 0 - 44 U/L   Alkaline Phosphatase 114 38 - 126 U/L   Total Bilirubin 0.5 0.3 - 1.2 mg/dL   GFR calc non Af Amer >60 >60 mL/min   GFR calc Af Amer >60 >60 mL/min   Anion gap 10 5 - 15    Comment: Performed at [facility], [place] [address], [place], [place] [address]  Ethanol     Status: None   Collection Time: 03/18/19  9:53 PM  Result Value Ref Range   Alcohol, Ethyl (B) <10 <10 mg/dL    Comment: (NOTE) Lowest detectable limit for serum alcohol is 10 mg/dL. For medical purposes only. Performed at [facility], [place] [address] [place], [place] [address]   cbc     Status: Abnormal   Collection Time: 03/18/19  9:53 PM  Result Value Ref Range   WBC 12.2 (H) 4.0 - 10.5 K/uL   RBC 4.99 4.22 - 5.81 MIL/uL   Hemoglobin 15.3 13.0 - 17.0 g/dL   HCT 46.2 39.0 - 52.0 %   MCV 92.6 80.0 - 100.0 fL    MCH 30.7 26.0 - 34.0 pg   MCHC 33.1 30.0 - 36.0 g/dL   RDW 13.2 11.5 - 15.5 %   Platelets 153 150 - 400 K/uL  nRBC 0.0 0.0 - 0.2 %    Comment: Performed at [facility], [place] [address] [place], [place] [address]  Rapid urine drug screen (hospital performed)     Status: Abnormal   Collection Time: 03/18/19  9:53 PM  Result Value Ref Range   Opiates POSITIVE (A) NONE DETECTED   Cocaine NONE DETECTED NONE DETECTED   Benzodiazepines POSITIVE (A) NONE DETECTED   Amphetamines NONE DETECTED NONE DETECTED   Tetrahydrocannabinol POSITIVE (A) NONE DETECTED   Barbiturates NONE DETECTED NONE DETECTED    Comment: (NOTE) DRUG SCREEN FOR MEDICAL PURPOSES ONLY.  IF CONFIRMATION IS NEEDED FOR ANY PURPOSE, NOTIFY LAB WITHIN 5 DAYS. LOWEST DETECTABLE LIMITS FOR URINE DRUG SCREEN Drug Class                     Cutoff (ng/mL) Amphetamine and metabolites    1000 Barbiturate and metabolites    200 Benzodiazepine                 250 Tricyclics and metabolites     300 Opiates and metabolites        300 Cocaine and metabolites        300 THC                            50 Performed at [facility], [place] [address] [place],  [address]   SARS Coronavirus 2 [facility] order, Performed in [facility] hospital lab) Nasopharyngeal Nasopharyngeal Swab     Status: None   Collection Time: 03/18/19 10:49 PM   Specimen: Nasopharyngeal Swab  Result Value Ref Range   SARS Coronavirus 2 NEGATIVE NEGATIVE    Comment: (NOTE) If result is NEGATIVE SARS-CoV-2 target nucleic acids are NOT DETECTED. The SARS-CoV-2 RNA is generally detectable in upper and lower  respiratory specimens during the acute phase of infection. The lowest  concentration of SARS-CoV-2 viral copies this assay can detect is 250  copies / mL. A negative result does not preclude SARS-CoV-2 infection  and should not be used as the sole basis for treatment or other  patient management decisions.  A  negative result may occur with  improper specimen collection / handling, submission of specimen other  than nasopharyngeal swab, presence of viral mutation(s) within the  areas targeted by this assay, and inadequate number of viral copies  (<250 copies / mL). A negative result must be combined with clinical  observations, patient history, and epidemiological information. If result is POSITIVE SARS-CoV-2 target nucleic acids are DETECTED. The SARS-CoV-2 RNA is generally detectable in upper and lower  respiratory specimens dur ing the acute phase of infection.  Positive  results are indicative of active infection with SARS-CoV-2.  Clinical  correlation with patient history and other diagnostic information is  necessary to determine patient infection status.  Positive results do  not rule out bacterial infection or co-infection with other viruses. If result is PRESUMPTIVE POSTIVE SARS-CoV-2 nucleic acids MAY BE PRESENT.   A presumptive positive result was obtained on the submitted specimen  and confirmed on repeat testing.  While 2019 novel coronavirus  (SARS-CoV-2) nucleic acids may be present in the submitted sample  additional confirmatory testing may be necessary for epidemiological  and / or clinical management purposes  to differentiate between  SARS-CoV-2 and other Sarbecovirus currently known to infect humans.  If clinically indicated additional testing with an alternate test  methodology [phone]) is advised.  The SARS-CoV-2 RNA is generally  detectable in upper and lower respiratory sp ecimens during the acute  phase of infection. The expected result is Negative. Fact Sheet for Patients:  StrictlyIdeas.no Fact Sheet for Healthcare Providers: BankingDealers.co.za This test is not yet approved or cleared by the [place] FDA and has been authorized for detection and/or diagnosis of SARS-CoV-2 by FDA under an Emergency Use  Authorization (EUA).  This EUA will remain in effect (meaning this test can be used) for the duration of the COVID-19 declaration under Section 564(b)(1) of the Act, 21 U.S.C. section 360bbb-3(b)(1), unless the authorization is terminated or revoked sooner. Performed at [facility], [place] [address] [place], [place] [address]     Medications:  Current Facility-Administered Medications  Medication Dose Route Frequency Provider Last Rate Last Dose  . acetaminophen (TYLENOL) tablet 650 mg  650 mg Oral Q4H PRN Petrucelli, Samantha R, PA-C   650 mg at 03/19/19 0057  . alum & mag hydroxide-simeth (MAALOX/MYLANTA) 200-200-20 MG/5ML suspension 30 mL  30 mL Oral Q6H PRN Petrucelli, Samantha R, PA-C      . buPROPion (WELLBUTRIN XL) 24 hr tablet 150 mg  150 mg Oral Daily , , MD   150 mg at 03/19/19 1140  . metFORMIN (GLUCOPHAGE) tablet 500 mg  500 mg Oral BID Little, Wenda Overland, MD      . ondansetron [facility]) tablet 4 mg  4 mg Oral Q8H PRN Petrucelli, Samantha R, PA-C      . oxyCODONE (Oxy IR/ROXICODONE) immediate release tablet 15 mg  15 mg Oral Q8H PRN Little, Wenda Overland, MD   15 mg at 03/19/19 0904  . simvastatin (ZOCOR) tablet 40 mg  40 mg Oral QPM Little, Wenda Overland, MD      . tamsulosin [facility]) capsule 0.4 mg  0.4 mg Oral QPM Little, Wenda Overland, MD      . traZODone (DESYREL) tablet 50 mg  50 mg Oral QHS PRN Corena Pilgrim, MD       Current Outpatient Medications  Medication Sig Dispense Refill  . Blood Glucose Monitoring Suppl (ACCU-CHEK GUIDE) w/Device KIT 1 Piece by Does not apply route as directed. 1 kit 0  . DULoxetine (CYMBALTA) 30 MG capsule Take 30 mg by mouth daily.    Marland Kitchen glucose blood (ACCU-CHEK GUIDE) test strip Use as instructed 100 each 2  . metFORMIN (GLUCOPHAGE) 500 MG tablet Take 500 mg by mouth 2 (two) times daily.    Marland Kitchen oxyCODONE (ROXICODONE) 15 MG immediate release tablet every 4 (four) hours as needed (pain).     . simvastatin  (ZOCOR) 40 MG tablet Take 40 mg by mouth daily.    . tamsulosin (FLOMAX) 0.4 MG CAPS capsule Take 0.4 mg by mouth.    . traZODone (DESYREL) 100 MG tablet Take 100 mg by mouth at bedtime.    Marland Kitchen zolpidem (AMBIEN) 10 MG tablet Take 10 mg by mouth at bedtime.      Musculoskeletal: Strength & Muscle Tone: within normal limits Gait & Station: normal Patient leans: N/A  Psychiatric Specialty Exam: Physical Exam  Constitutional: He is oriented to person, place, and time. He appears well-developed and well-nourished.  HENT:  Head: Normocephalic.  Respiratory: Effort normal.  Musculoskeletal: Normal range of motion.  Neurological: He is alert and oriented to person, place, and time.  Psychiatric: His speech is normal and behavior is normal. Judgment and thought content normal. His mood appears anxious. Cognition and memory are normal.    Review of Systems  Psychiatric/Behavioral: Positive for depression and substance abuse (prescription medications). The patient is nervous/anxious.   All other systems reviewed and are negative.   Blood pressure (!) 146/102, pulse 86, temperature 98.7 F (37.1 C), temperature source Oral, resp. rate 16, height 5' 4" (1.626 m), weight 83.9 kg, SpO2 95 %.Body mass index is 31.76 kg/m.  General Appearance: Casual  Eye Contact:  Fair  Speech:  Clear and Coherent  Volume:  Normal  Mood:  Anxious and Irritable  Affect:  Congruent  Thought Process:  Coherent and Descriptions of Associations: Intact  Orientation:  Full (Time, Place, and Person)  Thought Content:  Logical and but confused with events that happened  Suicidal Thoughts:  No  Homicidal Thoughts:  No  Memory:  Immediate;   Good Recent;   Fair Remote;   Fair  Judgement:  Impaired  Insight:  Shallow  Psychomotor Activity:  Normal  Concentration:  Concentration: Good and Attention Span: Fair  Recall:  [employer] of Knowledge:  Fair  Language:  Good  Akathisia:  Negative  Handed:  Right  AIMS  (if indicated):     Assets:  Agricultural consultant Housing Social Support  ADL's:  Intact  Cognition:  WNL  Sleep:        Treatment Plan Summary: Daily contact with patient to assess and evaluate symptoms and progress in treatment and Medication management   Discontinue Cymbalta due to concern for Serotonin syndrome Start Wellbutrin XL 150 mg daily for depression and anxiety  Disposition: Recommend psychiatric Inpatient admission when medically cleared.  This service was provided via telemedicine using a 2-way, interactive audio and video technology.  Names of all persons participating in this telemedicine service and their role in this encounter. Name: Sharene Butters Role: Patient  Name: Jinny Blossom Role: [place]  Name: Corena Pilgrim, MD Role: Psychiatric  Name:  Role:     Ethelene Hal, NP 03/19/2019 1:37 PM  Patient seen face-to-face for psychiatric evaluation, chart reviewed and case discussed with the physician extender and developed treatment plan. Reviewed the information documented and agree with the treatment plan. Corena Pilgrim, MD

## 2019-03-20 ENCOUNTER — Encounter (HOSPITAL_COMMUNITY): Payer: Self-pay | Admitting: Registered Nurse

## 2019-03-20 ENCOUNTER — Other Ambulatory Visit: Payer: Self-pay | Admitting: Registered Nurse

## 2019-03-20 ENCOUNTER — Other Ambulatory Visit: Payer: Self-pay

## 2019-03-20 ENCOUNTER — Encounter (HOSPITAL_COMMUNITY): Payer: Self-pay

## 2019-03-20 ENCOUNTER — Inpatient Hospital Stay (HOSPITAL_COMMUNITY)
Admission: AD | Admit: 2019-03-20 | Discharge: 2019-03-25 | DRG: 885 | Disposition: A | Discharge status: Home / Self Care | Payer: Medicare Other | Source: Intra-hospital | Attending: Psychiatry | Admitting: Psychiatry

## 2019-03-20 DIAGNOSIS — Z79899 Other long term (current) drug therapy: Secondary | ICD-10-CM

## 2019-03-20 DIAGNOSIS — F1994 Other psychoactive substance use, unspecified with psychoactive substance-induced mood disorder: Secondary | ICD-10-CM | POA: Diagnosis present

## 2019-03-20 DIAGNOSIS — E119 Type 2 diabetes mellitus without complications: Secondary | ICD-10-CM | POA: Diagnosis present

## 2019-03-20 DIAGNOSIS — R45851 Suicidal ideations: Secondary | ICD-10-CM | POA: Diagnosis present

## 2019-03-20 DIAGNOSIS — E785 Hyperlipidemia, unspecified: Secondary | ICD-10-CM | POA: Diagnosis present

## 2019-03-20 DIAGNOSIS — F332 Major depressive disorder, recurrent severe without psychotic features: Secondary | ICD-10-CM

## 2019-03-20 DIAGNOSIS — R45850 Homicidal ideations: Secondary | ICD-10-CM | POA: Diagnosis present

## 2019-03-20 DIAGNOSIS — Z20828 Contact with and (suspected) exposure to other viral communicable diseases: Secondary | ICD-10-CM | POA: Diagnosis present

## 2019-03-20 DIAGNOSIS — Z87891 Personal history of nicotine dependence: Secondary | ICD-10-CM | POA: Diagnosis not present

## 2019-03-20 DIAGNOSIS — G4700 Insomnia, unspecified: Secondary | ICD-10-CM | POA: Diagnosis present

## 2019-03-20 DIAGNOSIS — F259 Schizoaffective disorder, unspecified: Secondary | ICD-10-CM | POA: Diagnosis present

## 2019-03-20 DIAGNOSIS — F419 Anxiety disorder, unspecified: Secondary | ICD-10-CM | POA: Diagnosis present

## 2019-03-20 DIAGNOSIS — Z7984 Long term (current) use of oral hypoglycemic drugs: Secondary | ICD-10-CM

## 2019-03-20 DIAGNOSIS — I10 Essential (primary) hypertension: Secondary | ICD-10-CM | POA: Diagnosis present

## 2019-03-20 MED ORDER — OXYCODONE HCL 5 MG PO TABS
15.0000 mg | ORAL_TABLET | ORAL | Status: DC | PRN
  Administered 2019-03-20 – 2019-03-21 (×2): 15 mg via ORAL
  Filled 2019-03-20 (×2): qty 3

## 2019-03-20 MED ORDER — TAMSULOSIN HCL 0.4 MG PO CAPS
0.4000 mg | ORAL_CAPSULE | Freq: Every day | ORAL | Status: DC
  Administered 2019-03-21 – 2019-03-25 (×5): 0.4 mg via ORAL
  Filled 2019-03-20 (×7): qty 1

## 2019-03-20 MED ORDER — CLONIDINE HCL 0.1 MG PO TABS
0.1000 mg | ORAL_TABLET | Freq: Once | ORAL | Status: AC
  Administered 2019-03-20: 0.1 mg via ORAL
  Filled 2019-03-20 (×2): qty 1

## 2019-03-20 MED ORDER — BUPROPION HCL ER (XL) 150 MG PO TB24
150.0000 mg | ORAL_TABLET | Freq: Every day | ORAL | Status: DC
  Administered 2019-03-21: 150 mg via ORAL
  Filled 2019-03-20 (×3): qty 1

## 2019-03-20 MED ORDER — OXYCODONE HCL 5 MG PO TABS
5.0000 mg | ORAL_TABLET | Freq: Four times a day (QID) | ORAL | Status: DC | PRN
  Administered 2019-03-20: 10 mg via ORAL
  Filled 2019-03-20: qty 2

## 2019-03-20 MED ORDER — TRAZODONE HCL 50 MG PO TABS
50.0000 mg | ORAL_TABLET | Freq: Every evening | ORAL | Status: DC | PRN
  Administered 2019-03-20: 50 mg via ORAL
  Filled 2019-03-20 (×7): qty 1

## 2019-03-20 MED ORDER — SIMVASTATIN 40 MG PO TABS
40.0000 mg | ORAL_TABLET | Freq: Every day | ORAL | Status: DC
  Administered 2019-03-21 – 2019-03-25 (×5): 40 mg via ORAL
  Filled 2019-03-20 (×7): qty 1

## 2019-03-20 MED ORDER — METFORMIN HCL 500 MG PO TABS
500.0000 mg | ORAL_TABLET | Freq: Two times a day (BID) | ORAL | Status: DC
  Administered 2019-03-20 – 2019-03-25 (×9): 500 mg via ORAL
  Filled 2019-03-20 (×15): qty 1

## 2019-03-20 MED ORDER — HYDROXYZINE HCL 25 MG PO TABS
25.0000 mg | ORAL_TABLET | Freq: Three times a day (TID) | ORAL | Status: DC | PRN
  Administered 2019-03-21 – 2019-03-24 (×5): 25 mg via ORAL
  Filled 2019-03-20 (×5): qty 1

## 2019-03-20 NOTE — BH Assessment (Signed)
[facility] Assessment Progress Note  Per Buford Dresser, DO, this pt requires psychiatric hospitalization at this time.  Heather, RN has assigned pt to [facility] Rm 403-2.  Pt has signed Voluntary Admission and Consent for Treatment, as well as Consent to Release Information to pt's PCP, Redmond School, and signed forms have been faxed to [facility].  Pt's nurse, Tilda Franco, has been notified, and agrees to send original paperwork along with pt via B[place]y Pries, and to call report to [phone].  Jalene Mullet, [place] Coordinator [phone]

## 2019-03-20 NOTE — Progress Notes (Signed)
03/20/2019  [facility] returned call. Report given to Elberta Fortis, [place].

## 2019-03-20 NOTE — Progress Notes (Signed)
Patient ID: Victor Fields, male   DOB: 01-18-1956, 63 y.o.   MRN: [id number]   Patient admitted to the unit due to an incident that occurred at his house with his wife.  Patient stated he impulsively started throwing things and threatening his wife and to kill himself.  Patient stated he then came in to the hospital for help because he didn't understand why what happened was happening.  Patient currently denies SI, HI and AVH.  Patient reported stopping all of he medications without his physicians consent.  Patient was noted to have an elevated blood pressure.    Skin assessment complete and patient found to have large surgical scar on his left legs.    Patient admitted to the unit without incident.

## 2019-03-20 NOTE — Tx Team (Signed)
Initial Treatment Plan 03/20/2019 6:46 PM Victor Fields [id number]    PATIENT STRESSORS: Marital or family conflict   PATIENT STRENGTHS: Communication skills   PATIENT IDENTIFIED PROBLEMS: I came off of all my medications without my doctors consent.                      DISCHARGE CRITERIA:  Improved stabilization in mood, thinking, and/or behavior  PRELIMINARY DISCHARGE PLAN: Return to previous living arrangement  PATIENT/FAMILY INVOLVEMENT: This treatment plan has been presented to and reviewed with the patient, Victor Fields, and/or family member,   The patient and family have been given the opportunity to ask questions and make suggestions.  Victor Crane, RN 03/20/2019, 6:46 PM

## 2019-03-20 NOTE — Consult Note (Addendum)
Telepsych Consultation   Reason for Consult:  Aggressive behavior Referring Physician:  EDP Location of Patient:  Location of Provider: [facility]  Patient Identification: Victor Fields MRN:  [id number] Principal Diagnosis: Major depressive disorder, recurrent episode ([place]) Diagnosis:  Principal Problem:   Major depressive disorder, recurrent episode ([place])   Total Time spent with patient: 30 minutes  Subjective:   Victor Fields is a 63 y.o. male patient admitted with aggressive behavior and anger outburst at home.  HPI:  Pt was seen and chart reviewed with treatment team and Dr Darleene Cleaver. Pt denies suicidal/homicidal ideation, denies auditory/visual hallucinations and does not appear to be responding to internal stimuli. Pt stated he knows what happened yesterday but he does not know why it happened. He stated he has never had an outburst like that before. He stopped taking his Buspar and Cymbalta 2-3 weeks ago because he felt they were making his mind and body slow.  He is under the care of Dr Redmond School, who is prescribing his antidepressant/anxiety medication. Dr Gerarda Fraction, according to [facility] lookup, is also prescribing Oxycodone Hcl 15 mg 120 pills for 20 days and Ambien 10 mg at bedtime. Pt stated he has chronic pain syndrome.  Pt appears confused today and is having difficulty processing questions. He stated his hands and legs feel jerky and restless. He was restarted on Cymbalta in the emergency room. Today, this was discontinued and Wellbutrin XL 150 mg PO started. Pt is suspected to be having some symptoms of serotonin syndrome due to his reports of jery, restless movements and being unable to sleep.  Serotonin syndrome could possibly be caused by the concurrent use of an SSRI (Cymbalta), Buspar, Ambien and Opiates. Pt is worried his wife will not allow him to come home after what he threatened to kill her.  Pt was provided support and encouragement to talk with his  family to ease his fear. Pt also stated, on admission, that he had access to weapons but today stated his son has secured all the weapons.   Pt is recommended for an inpatient hospitalization for stabilization. TTS to seek placement.    Today assessment 03/20/2019:  Victor Fields, 63 y.o., male patient seen via tele psych by this provider, Dr. Mariea Clonts; and chart reviewed on 03/20/19.  On evaluation Victor Fields reports "I threatened to kill my wife and myself. I don't know why."  Patient continues to be depressed, angry, and states that he needs help.  Will continue to seek inpatient treatment for stabilization.      Past Psychiatric History: As above  Risk to Self:  Denies SI. Risk to Others:  Denies HI.  Prior Inpatient Therapy:  Hospitalized in 1979 for suicide attempt. Prior Outpatient Therapy:  He is followed by his PCP.  Past Medical History:  Past Medical History:  Diagnosis Date  . Back pain   . Elevated cholesterol   . Enlarged prostate   . Hypertension     Past Surgical History:  Procedure Laterality Date  . crush injuries    . LUNG SURGERY     Family History:  Family History  Problem Relation Age of Onset  . CAD Father   . Cancer Mother    Family Psychiatric  History: None per chart review.  Social History:  Social History   Substance and Sexual Activity  Alcohol Use No     Social History   Substance and Sexual Activity  Drug Use No    Social  History   Socioeconomic History  . Marital status: Married    Spouse name: Not on file  . Number of children: Not on file  . Years of education: Not on file  . Highest education level: Not on file  Occupational History  . Not on file  Social Needs  . Financial resource strain: Not on file  . Food insecurity    Worry: Not on file    Inability: Not on file  . Transportation needs    Medical: Not on file    Non-medical: Not on file  Tobacco Use  . Smoking status: Former Smoker    Types: Cigarettes  .  Smokeless tobacco: Never Used  Substance and Sexual Activity  . Alcohol use: No  . Drug use: No  . Sexual activity: Not on file  Lifestyle  . Physical activity    Days per week: Not on file    Minutes per session: Not on file  . Stress: Not on file  Relationships  . Social Herbalist on phone: Not on file    Gets together: Not on file    Attends religious service: Not on file    Active member of club or organization: Not on file    Attends meetings of clubs or organizations: Not on file    Relationship status: Not on file  Other Topics Concern  . Not on file  Social History Narrative      Used to be a Geophysicist/field seismologist   Used to smoke until 2012   Used to drink heavily a long time ago   Prior drug use   Current wife is 2nd wife-married 2007   Finished high school-did some college   Additional Social History: N/A    Allergies:   Allergies  Allergen Reactions  . Neurontin [Gabapentin] Other (See Comments)    hallucinations    Labs:  Results for orders placed or performed during the hospital encounter of 03/18/19 (from the past 48 hour(s))  Comprehensive metabolic panel     Status: Abnormal   Collection Time: 03/18/19  9:53 PM  Result Value Ref Range   Sodium 139 135 - 145 mmol/L   Potassium 3.9 3.5 - 5.1 mmol/L   Chloride 102 98 - 111 mmol/L   CO2 27 22 - 32 mmol/L   Glucose, Bld 120 (H) 70 - 99 mg/dL   BUN 5 (L) 8 - 23 mg/dL   Creatinine, Ser 1.00 0.61 - 1.24 mg/dL   Calcium 9.8 8.9 - 10.3 mg/dL   Total Protein 8.1 6.5 - 8.1 g/dL   Albumin 4.0 3.5 - 5.0 g/dL   AST 36 15 - 41 U/L   ALT 24 0 - 44 U/L   Alkaline Phosphatase 114 38 - 126 U/L   Total Bilirubin 0.5 0.3 - 1.2 mg/dL   GFR calc non Af Amer >60 >60 mL/min   GFR calc Af Amer >60 >60 mL/min   Anion gap 10 5 - 15    Comment: Performed at [facility], [place] [address] [place], [place] [address]  Ethanol     Status: None   Collection Time: 03/18/19  9:53 PM  Result Value Ref Range    Alcohol, Ethyl (B) <10 <10 mg/dL    Comment: (NOTE) Lowest detectable limit for serum alcohol is 10 mg/dL. For medical purposes only. Performed at [facility], [place] [address], [place], [place] [address]   cbc     Status: Abnormal  Collection Time: 03/18/19  9:53 PM  Result Value Ref Range   WBC 12.2 (H) 4.0 - 10.5 K/uL   RBC 4.99 4.22 - 5.81 MIL/uL   Hemoglobin 15.3 13.0 - 17.0 g/dL   HCT 46.2 39.0 - 52.0 %   MCV 92.6 80.0 - 100.0 fL   MCH 30.7 26.0 - 34.0 pg   MCHC 33.1 30.0 - 36.0 g/dL   RDW 13.2 11.5 - 15.5 %   Platelets 153 150 - 400 K/uL   nRBC 0.0 0.0 - 0.2 %    Comment: Performed at [facility], [place] [address], [place], [place] [address]  Rapid urine drug screen (hospital performed)     Status: Abnormal   Collection Time: 03/18/19  9:53 PM  Result Value Ref Range   Opiates POSITIVE (A) NONE DETECTED   Cocaine NONE DETECTED NONE DETECTED   Benzodiazepines POSITIVE (A) NONE DETECTED   Amphetamines NONE DETECTED NONE DETECTED   Tetrahydrocannabinol POSITIVE (A) NONE DETECTED   Barbiturates NONE DETECTED NONE DETECTED    Comment: (NOTE) DRUG SCREEN FOR MEDICAL PURPOSES ONLY.  IF CONFIRMATION IS NEEDED FOR ANY PURPOSE, NOTIFY LAB WITHIN 5 DAYS. LOWEST DETECTABLE LIMITS FOR URINE DRUG SCREEN Drug Class                     Cutoff (ng/mL) Amphetamine and metabolites    1000 Barbiturate and metabolites    200 Benzodiazepine                 696 Tricyclics and metabolites     300 Opiates and metabolites        300 Cocaine and metabolites        300 THC                            50 Performed at [facility], [place] [address], [place], [place] [address]   SARS Coronavirus 2 [facility] order, Performed in [facility] hospital lab) Nasopharyngeal Nasopharyngeal Swab     Status: None   Collection Time: 03/18/19 10:49 PM   Specimen: Nasopharyngeal Swab  Result Value Ref Range   SARS Coronavirus 2 NEGATIVE NEGATIVE     Comment: (NOTE) If result is NEGATIVE SARS-CoV-2 target nucleic acids are NOT DETECTED. The SARS-CoV-2 RNA is generally detectable in upper and lower  respiratory specimens during the acute phase of infection. The lowest  concentration of SARS-CoV-2 viral copies this assay can detect is 250  copies / mL. A negative result does not preclude SARS-CoV-2 infection  and should not be used as the sole basis for treatment or other  patient management decisions.  A negative result may occur with  improper specimen collection / handling, submission of specimen other  than nasopharyngeal swab, presence of viral mutation(s) within the  areas targeted by this assay, and inadequate number of viral copies  (<250 copies / mL). A negative result must be combined with clinical  observations, patient history, and epidemiological information. If result is POSITIVE SARS-CoV-2 target nucleic acids are DETECTED. The SARS-CoV-2 RNA is generally detectable in upper and lower  respiratory specimens dur ing the acute phase of infection.  Positive  results are indicative of active infection with SARS-CoV-2.  Clinical  correlation with patient history and other diagnostic information is  necessary to determine patient infection status.  Positive results do  not rule out bacterial infection or co-infection with other viruses. If result is PRESUMPTIVE  POSTIVE SARS-CoV-2 nucleic acids MAY BE PRESENT.   A presumptive positive result was obtained on the submitted specimen  and confirmed on repeat testing.  While 2019 novel coronavirus  (SARS-CoV-2) nucleic acids may be present in the submitted sample  additional confirmatory testing may be necessary for epidemiological  and / or clinical management purposes  to differentiate between  SARS-CoV-2 and other Sarbecovirus currently known to infect humans.  If clinically indicated additional testing with an alternate test  methodology [id number]) is advised. The SARS-CoV-2  RNA is generally  detectable in upper and lower respiratory sp ecimens during the acute  phase of infection. The expected result is Negative. Fact Sheet for Patients:  StrictlyIdeas.no Fact Sheet for Healthcare Providers: BankingDealers.co.za This test is not yet approved or cleared by the [place] FDA and has been authorized for detection and/or diagnosis of SARS-CoV-2 by FDA under an Emergency Use Authorization (EUA).  This EUA will remain in effect (meaning this test can be used) for the duration of the COVID-19 declaration under Section 564(b)(1) of the Act, 21 U.S.C. section 360bbb-3(b)(1), unless the authorization is terminated or revoked sooner. Performed at [facility], [place] [address], [place], [place] [address]     Medications:  Current Facility-Administered Medications  Medication Dose Route Frequency Provider Last Rate Last Dose  . acetaminophen (TYLENOL) tablet 650 mg  650 mg Oral Q4H PRN Petrucelli, Samantha R, PA-C   650 mg at 03/19/19 1924  . alum & mag hydroxide-simeth (MAALOX/MYLANTA) 200-200-20 MG/5ML suspension 30 mL  30 mL Oral Q6H PRN Petrucelli, Samantha R, PA-C      . buPROPion (WELLBUTRIN XL) 24 hr tablet 150 mg  150 mg Oral Daily Akintayo, Mojeed, MD   150 mg at 03/20/19 0929  . metFORMIN (GLUCOPHAGE) tablet 500 mg  500 mg Oral BID Little, Wenda Overland, MD      . ondansetron [facility]) tablet 4 mg  4 mg Oral Q8H PRN Petrucelli, Samantha R, PA-C      . oxyCODONE (Oxy IR/ROXICODONE) immediate release tablet 15 mg  15 mg Oral Q6H PRN Davonna Belling, MD   15 mg at 03/20/19 0930  . simvastatin (ZOCOR) tablet 40 mg  40 mg Oral QPM Little, Wenda Overland, MD   40 mg at 03/19/19 1715  . tamsulosin (FLOMAX) capsule 0.4 mg  0.4 mg Oral QPM Little, Wenda Overland, MD   0.4 mg at 03/19/19 1714  . traZODone (DESYREL) tablet 50 mg  50 mg Oral QHS PRN Corena Pilgrim, MD   50 mg at 03/19/19 2213    Current Outpatient Medications  Medication Sig Dispense Refill  . Blood Glucose Monitoring Suppl (ACCU-CHEK GUIDE) w/Device KIT 1 Piece by Does not apply route as directed. 1 kit 0  . DULoxetine (CYMBALTA) 30 MG capsule Take 30 mg by mouth daily.    Marland Kitchen glucose blood (ACCU-CHEK GUIDE) test strip Use as instructed 100 each 2  . metFORMIN (GLUCOPHAGE) 500 MG tablet Take 500 mg by mouth 2 (two) times daily.    Marland Kitchen oxyCODONE (ROXICODONE) 15 MG immediate release tablet every 4 (four) hours as needed (pain).     . simvastatin (ZOCOR) 40 MG tablet Take 40 mg by mouth daily.    . tamsulosin (FLOMAX) 0.4 MG CAPS capsule Take 0.4 mg by mouth.    . traZODone (DESYREL) 100 MG tablet Take 100 mg by mouth at bedtime.    Marland Kitchen zolpidem (AMBIEN) 10 MG tablet Take 10 mg by mouth at bedtime.  Musculoskeletal: Strength & Muscle Tone: within normal limits Gait & Station: normal Patient leans: N/A  Psychiatric Specialty Exam: Physical Exam  Nursing note and vitals reviewed. Constitutional: He is oriented to person, place, and time. He appears well-developed and well-nourished.  HENT:  Head: Normocephalic.  Respiratory: Effort normal.  Musculoskeletal: Normal range of motion.  Neurological: He is alert and oriented to person, place, and time.  Psychiatric: His speech is normal and behavior is normal. Judgment and thought content normal. His mood appears anxious. His affect is angry. Cognition and memory are normal. He exhibits a depressed mood.    Review of Systems  Psychiatric/Behavioral: Positive for depression and substance abuse (prescription medications). The patient is nervous/anxious.   All other systems reviewed and are negative.   Blood pressure (!) 148/99, pulse 91, temperature 98.3 F (36.8 C), resp. rate 18, height _0  (1.626 m), weight 83.9 kg, SpO2 95 %.Body mass index is 31.76 kg/m.  General Appearance: Casual  Eye Contact:  Fair  Speech:  Clear and Coherent  Volume:  Normal  Mood:   Anxious and Irritable  Affect:  Congruent  Thought Process:  Coherent and Descriptions of Associations: Intact  Orientation:  Full (Time, Place, and Person)  Thought Content:  Logical and but confused with events that happened  Suicidal Thoughts:  No  Homicidal Thoughts:  No  Memory:  Immediate;   Good Recent;   Fair Remote;   Fair  Judgement:  Impaired  Insight:  Shallow  Psychomotor Activity:  Normal  Concentration:  Concentration: Good and Attention Span: Fair  Recall:  [employer] of Knowledge:  Fair  Language:  Good  Akathisia:  Negative  Handed:  Right  AIMS (if indicated):   N/A  Assets:  Agricultural consultant Housing Social Support  ADL's:  Intact  Cognition:  WNL  Sleep:   N/A     Treatment Plan Summary: Daily contact with patient to assess and evaluate symptoms and progress in treatment and Medication management   -Continue Wellbutrin XL 150 mg daily for depression and anxiety  Disposition: Recommend psychiatric Inpatient admission when medically cleared.  This service was provided via telemedicine using a 2-way, interactive audio and video technology.   Will continue with current treatment plan.  No changes; continue to seek inpatient psychiatric treatment    Names of all persons participating in this telemedicine service and their role in this encounter. Name: Sharene Butters Role: Patient  Name: Earleen Newport Role: NP  Name: Dr. Mariea Clonts Role: Psychiatric    Earleen Newport, NP 03/20/2019 1:39 PM  Patient seen by telemedicine for psychiatric evaluation, chart reviewed and case discussed with the physician extender and developed treatment plan. Reviewed the information documented and agree with the treatment plan.  Buford Dresser, DO 03/20/19 10:47 PM

## 2019-03-20 NOTE — Progress Notes (Signed)
Patient verbalized that his positive event for the day is that he was grateful for no longer being in the hospital across the street. His goal for tomorrow is to talk to his doctor about getting his medications adjusted.

## 2019-03-20 NOTE — Progress Notes (Signed)
03/20/2019  [place] for transport to [facility].

## 2019-03-20 NOTE — Progress Notes (Signed)
03/20/2019  1640  Called [facility] to give report. Left name and number for nurse to return call.

## 2019-03-20 NOTE — Progress Notes (Signed)
03/20/2019  1713  Sent message to Dr Johnney Killian regarding BP 153/102. Waiting for response.

## 2019-03-21 DIAGNOSIS — F1994 Other psychoactive substance use, unspecified with psychoactive substance-induced mood disorder: Secondary | ICD-10-CM

## 2019-03-21 MED ORDER — LISINOPRIL 20 MG PO TABS
20.0000 mg | ORAL_TABLET | Freq: Every day | ORAL | Status: DC
  Administered 2019-03-21 – 2019-03-25 (×5): 20 mg via ORAL
  Filled 2019-03-21 (×8): qty 1

## 2019-03-21 MED ORDER — METOPROLOL TARTRATE 25 MG PO TABS
25.0000 mg | ORAL_TABLET | Freq: Two times a day (BID) | ORAL | Status: DC
  Administered 2019-03-21 – 2019-03-25 (×8): 25 mg via ORAL
  Filled 2019-03-21 (×13): qty 1

## 2019-03-21 MED ORDER — ACETAMINOPHEN 325 MG PO TABS
650.0000 mg | ORAL_TABLET | Freq: Four times a day (QID) | ORAL | Status: DC | PRN
  Administered 2019-03-22: 12:00:00 650 mg via ORAL
  Filled 2019-03-21: qty 2

## 2019-03-21 MED ORDER — CLONIDINE HCL 0.1 MG PO TABS: 0.1000 mg | ORAL_TABLET | Freq: Three times a day (TID) | ORAL | Status: DC | PRN

## 2019-03-21 MED ORDER — OXYCODONE HCL 5 MG PO TABS
10.0000 mg | ORAL_TABLET | Freq: Three times a day (TID) | ORAL | Status: DC | PRN
  Administered 2019-03-21 – 2019-03-24 (×7): 10 mg via ORAL
  Filled 2019-03-21 (×9): qty 2
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
## 2019-03-21 NOTE — Progress Notes (Signed)
Patient rated his day as a 5 out of a possible 10. He shared with the group that he had a good talk with his doctor and that his blood pressure medicine was started. He is feeling better and calmer. He could not state what his goal is going to be.
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
## 2019-03-21 NOTE — Treatment Plan (Signed)
Discussed case with Dr. Parke Poisson. Pt with hx of diabetes and documented hx of HTN not on meds prior to admit. Patient noted to have poorly controlled BP since presenting to behavioral health with BP peaking at 186/118. Pt was started on trial of scheduled clonidine, however BP remains poorly controlled.   Allergy list reviewed, as were labs. Would empirically start on 20mg  lisinopril, which would also help with renal protection in a diabetic. Would also start metoprolol 25mg  BID with hold parameters. Will continue clonidine as needed for sbp>180. Anticipate blood pressures should improve with the above regimen. Would recommend repeating BMET with focus on Cr in one week and to follow up with PCP when ultimately discharged. Please do not hesitate to call if there are any further questions or concerns. Thank you for allowing me to be in involved in Victor Fields care.  Marylu Lund, MD [facility]

## 2019-03-21 NOTE — Progress Notes (Signed)
Spiritual care group on grief and loss facilitated by chaplain Jerene Pitch  Group Goal:  Support / Education around grief and loss Members engage in facilitated group support and psycho-social education.  Group Description:  Following introductions and group rules, group members engaged in facilitated group dialog and support around topic of loss, with particular support around experiences of loss in their lives. Group Identified types of loss (relationships / self / things) and identified patterns, circumstances, and changes that precipitate losses. Reflected on thoughts / feelings around loss, normalized grief responses, and recognized variety in grief experience. Patient Progress:  Present through 80% of group.

## 2019-03-21 NOTE — Progress Notes (Signed)
Adult Psychoeducational Group Note  Date:  03/21/2019 Time:  9:45 AM  Group Topic/Focus:  Goals Group:   The focus of this group is to help patients establish daily goals to achieve during treatment and discuss how the patient can incorporate goal setting into their daily lives to aide in recovery. Orientation:   The focus of this group is to educate the patient on the purpose and policies of crisis stabilization and provide a format to answer questions about their admission.  The group details unit policies and expectations of patients while admitted.  Participation Level:  Active  Participation Quality:  Attentive  Affect:  Appropriate  Cognitive:  Alert  Insight: Appropriate  Engagement in Group:  Engaged  Modes of Intervention:  Discussion and Education  Additional Comments:     Pt participated in goals group. Pt shared why he is here and whatshe would like to work on during his stay here. Pt states he threw his medication away. Pt said the medications made him "foggy." Pt states that he has started new medication during his stay here and he likes them better. Pt plans to speak to his doctor more in the future to discuss how his medications are doing and if he has any issues with them.   Today's topic is mindfulness. The group went over the definition of mindfulness and a few types techniques to try.  Lita Mains 03/21/2019, 9:45 AM
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
## 2019-03-21 NOTE — Progress Notes (Signed)
DAR NOTE: Patient presents with anxious affect and depressed mood.  Denies suicidal thoughts, auditory and visual hallucinations.  Complain of heart palpitation and tingling.  Vital signs obtained and reported to MD.  EKG ordered and result within normal limit.  Patient assessment and monitoring continues.  Rates depression at 1, hopelessness at 2, and anxiety at 6.  Maintained on routine safety checks.  Medications given as prescribed.  Support and encouragement offered as needed.  Attended group and participated.  States goal for today is "going home."  Patient visible in milieu with minimal interaction.  Patient is safe on and off the unit.

## 2019-03-22 LAB — TSH: TSH: 0.874 u[IU]/mL (ref 0.350–4.500)

## 2019-03-22 LAB — T4, FREE: Free T4: 0.97 ng/dL (ref 0.61–1.12)

## 2019-03-22 MED ORDER — CARBAMAZEPINE 100 MG PO CHEW
100.0000 mg | CHEWABLE_TABLET | Freq: Three times a day (TID) | ORAL | Status: DC
  Administered 2019-03-22: 100 mg via ORAL
  Filled 2019-03-22 (×3): qty 1

## 2019-03-22 MED ORDER — CARBAMAZEPINE 100 MG PO CHEW
100.0000 mg | CHEWABLE_TABLET | Freq: Two times a day (BID) | ORAL | Status: DC
  Administered 2019-03-22 – 2019-03-23 (×2): 100 mg via ORAL
  Filled 2019-03-22 (×4): qty 1

## 2019-03-22 NOTE — Progress Notes (Signed)
DAR NOTE: Pt present with flat affect and depressed mood in the unit. Pt complained of lower back pain and asked for his oxycodone. Pt was given two 5 mg oxycodone to make scheduled 10 mg. Pt was caught hide one pill in his hand, when asked why he did that, pt stated 10 mg of oxycodone keep him awake at night that's why he did not want to take the 10 mg, pt pleaded with the writer not to tell the doctor because he is addicted to oxycodone.  Pt's safety ensured with 15 minute and environmental checks. Pt currently denies SI/HI and A/V hallucinations. Pt verbally agrees to seek staff if SI/HI or A/VH occurs and to consult with staff before acting on these thoughts. Will continue POC.
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
## 2019-03-22 NOTE — Progress Notes (Signed)
D:  Patient's self inventory sheet, patient has poor sleep, sleep medication not helpful.  Fair appetite, low energy level, poor concentration.  Denied depression, anxiety, hopeless.  Denied withdrawals.  Denied SI.  Physical pain, neck and back, #3, pain medicine helpful.  Goal is try and understand why I did what I did.  Plan is to think.  No discharge plans. A: Medications administered per MD orders.  Emotional support and encouragement. R:  Patient denied SI and HI, contracts for safety.  Denied A/V hallucinations.  Safety maintained with 15 minute checisl

## 2019-03-22 NOTE — Plan of Care (Signed)
  Problem: Activity: Goal: Will verbalize the importance of balancing activity with adequate rest periods Outcome: Progressing   Problem: Education: Goal: Knowledge of the prescribed therapeutic regimen will improve Outcome: Progressing   Problem: Coping: Goal: Coping ability will improve Outcome: Progressing   
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
## 2019-03-22 NOTE — Progress Notes (Signed)
The patient's coping skill to be used following discharge is listening to music and playing video games. His goal for tomorrow is to feel better.

## 2019-03-22 NOTE — [facility]HH Group Notes (Signed)
Occupational Therapy Group Note  Date:  03/22/2019 Time:  1:11 PM  Group Topic/Focus:  Self Esteem Action Plan:   The focus of this group is to help patients create a plan to continue to build self-esteem after discharge.  Participation Level:  Active  Participation Quality:  Attentive  Affect:  Depressed  Cognitive:  Alert  Insight: Improving  Engagement in Group:  Engaged  Modes of Intervention:  Activity, Discussion, Education and Socialization  Additional Comments:    S: My self esteem is negative  O: OT tx with focus on self esteem building this date. Education given on definition of self esteem, with both causes of low and high self esteem identified. Activity given for pt to identify a positive/aspiring trait for each letter of the alphabet. Pt to work with peers to help complete activity and build positive thinking.   A: Pt engaged in discussion, sharing pertinent explanations of self esteem. When given the activity, pt with extreme difficulty completing. He states that he is not able to think of any positive words at this time and was feeling frustrated.  P: OT group will be x1 per week while pt inpatient  Zenovia Jarred, MSOT, OTR/L [place] Office: [place] 03/22/2019, 1:11 PM
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
## 2019-03-23 LAB — T3, FREE: T3, Free: 3.3 pg/mL (ref 2.0–4.4)

## 2019-03-23 MED ORDER — CARBAMAZEPINE 100 MG PO CHEW
100.0000 mg | CHEWABLE_TABLET | Freq: Three times a day (TID) | ORAL | Status: DC
  Administered 2019-03-23 – 2019-03-25 (×6): 100 mg via ORAL
  Filled 2019-03-23 (×12): qty 1

## 2019-03-23 NOTE — Progress Notes (Signed)
DAR NOTE: Patient presents with anxious affect and depressed mood.  Denies suicidal thoughts, auditory and visual hallucinations.  Described energy level as low and concentration as poor.  Rates depression at 0, hopelessness at 0, and anxiety at 3.  Maintained on routine safety checks.  Medications given as prescribed.  Support and encouragement offered as needed.  Attended group and participated.  States goal for today is "getting back to life."  Patient is visible with minimal interaction.  Ambulating on the unit with a cane.

## 2019-03-23 NOTE — [facility] Group Notes (Signed)
LCSW Aftercare Discharge Planning Group Note  03/23/2019 8:45am  Type of Group and Topic: Psychoeducational Group: Discharge Planning  Participation Level: Minimal, listened but did not participate.  Description of Group  Discharge planning group reviews patient's anticipated discharge plans and assists patients to anticipate and address any barriers to wellness/recovery in the community. Suicide prevention education is reviewed with patients in group.  Therapeutic Goals  1. Patients will state their anticipated discharge plan and mental health aftercare  2. Patients will identify potential barriers to wellness in the community setting  3. Patients will engage in problem solving, solution focused discussion of ways to anticipate and address barriers to wellness/recovery  Summary of Patient Progress  Plan for Discharge/Comments:  Transportation Means:  Supports:  Therapeutic Modalities:  [place], [place]  03/23/2019 2:51 PM

## 2019-03-23 NOTE — Progress Notes (Signed)
The patient had little to share with the group except to say that his day was "okay" and that he might be getting discharged tomorrow. His goal for tomorrow is to get discharged.

## 2019-03-23 NOTE — Progress Notes (Signed)
CSW spoke with the patient's wife, Cheyene Stude [phone]) to provide an update regarding the patient's discharge plans.   CSW informed Peter Congo that the patient is scheduled for discharge Friday (9/18) or Saturday (9/19) and that he plans to stay with his daughter.   Peter Congo expressed gratitude for the update and reports she does not have any further questions or concerns at this time.   CSW will continue to follow for a safe discharge.   Radonna Ricker, MSW, [place] Worker [facility]  Phone: [phone]

## 2019-03-23 NOTE — Progress Notes (Addendum)
[facility] MD Progress Note  03/23/2019 10:06 AM Victor Fields  MRN:  [id number] Subjective: Patient states "I am doing good now".  Patient continues to endorse concerns related to insomnia. Reports that he does not sleep well when he takes a full dose of oxycodone at night. Continues to deny any explosiveness, irritability, or anger. Denies suicidal thoughts, homicidal thoughts, and AVH.   Victor Fields is a 63 year old male who presented to [facility] voluntarily with his daughter after threatening to kill his wife and himself with a gun. Patient reported that he locked away his guns at that time. He continues to report that he cannot identify any stressors or triggers that led up to this event.  He does report a history of depression and that he was hospitalized in his 4's due to bizarre behavior.  Patient reports that he discontinued Cymbalta about 2 weeks ago. He was weaned off xanax earlier this year and reports his last dose was in April. Denies that he has taken any xanax since that time.   On evaluation today the patient is alert, oriented x 4, pleasant, and cooperative. No acute distress noted. No psychomotor agitation or tremor noted. No evidence of withdraw symptoms. Behavior appears stable. There have been no reports of agitation, irritability, or explosive behaviors.   Patient's wife has expressed concerns with the patient returning home. He plans to temporarily move in with his daughter or other family members.   Patient's wife was contacted with patient's expressed consent. She was not available to talk and will return call later today.    There is some concern for potential seizure activity related to patient weaning medications and possible benzodiazepine withdraw. Tegretol was initiated at 100 mg BID to assist with mood stability/explosive behavior and possible seizure activity.   Principal Problem: HI, consider Adjustment Disorder with Behavioral Disturbance versus Substance Induced Mood  Disorder.    Diagnosis: Active Problems:   Schizoaffective disorder (HCC)   Substance induced mood disorder (HCC)  Total Time spent with patient: 20 minutes  Past Psychiatric History: He reports remote history of alcohol and substance use years ago. One prior hospitalization at age 40 for anger outburst with suicide attempt. Denies other suicide attempts or hospitalizations. He was prescribed Cymbalta and Buspar for depression/anxiety but recently discontinued these medications four weeks ago.    Past Medical History:  Past Medical History:  Diagnosis Date  . Back pain   . Elevated cholesterol   . Enlarged prostate   . Hypertension     Past Surgical History:  Procedure Laterality Date  . crush injuries    . LUNG SURGERY     Family History:  Family History  Problem Relation Age of Onset  . CAD Father   . Cancer Mother    Family Psychiatric  History:  Social History:  Social History   Substance and Sexual Activity  Alcohol Use No     Social History   Substance and Sexual Activity  Drug Use No    Social History   Socioeconomic History  . Marital status: Married    Spouse name: Not on file  . Number of children: Not on file  . Years of education: Not on file  . Highest education level: Not on file  Occupational History  . Not on file  Social Needs  . Financial resource strain: Not on file  . Food insecurity    Worry: Not on file    Inability: Not on file  . Transportation  needs    Medical: Not on file    Non-medical: Not on file  Tobacco Use  . Smoking status: Former Smoker    Types: Cigarettes  . Smokeless tobacco: Never Used  Substance and Sexual Activity  . Alcohol use: No  . Drug use: No  . Sexual activity: Not on file  Lifestyle  . Physical activity    Days per week: Not on file    Minutes per session: Not on file  . Stress: Not on file  Relationships  . Social Herbalist on phone: Not on file    Gets together: Not on file     Attends religious service: Not on file    Active member of club or organization: Not on file    Attends meetings of clubs or organizations: Not on file    Relationship status: Not on file  Other Topics Concern  . Not on file  Social History Narrative      Used to be a Geophysicist/field seismologist   Used to smoke until 2012   Used to drink heavily a long time ago   Prior drug use   Current wife is 2nd wife-married 2007   Finished high school-did some college   Additional Social History:   Sleep: Fair  Appetite:  Good  Current Medications: Current Facility-Administered Medications  Medication Dose Route Frequency Provider Last Rate Last Dose  . acetaminophen (TYLENOL) tablet 650 mg  650 mg Oral Q6H PRN Connye Burkitt, NP   650 mg at 03/22/19 1202  . carbamazepine (TEGRETOL) chewable tablet 100 mg  100 mg Oral TID Corry Storie A, MD      . cloNIDine (CATAPRES) tablet 0.1 mg  0.1 mg Oral Q8H PRN Donne Hazel, MD      . hydrOXYzine (ATARAX/VISTARIL) tablet 25 mg  25 mg Oral TID PRN Rozetta Nunnery, NP   25 mg at 03/22/19 2204  . lisinopril (ZESTRIL) tablet 20 mg  20 mg Oral Daily Donne Hazel, MD   20 mg at 03/23/19 0806  . metFORMIN (GLUCOPHAGE) tablet 500 mg  500 mg Oral BID Lindon Romp A, NP   500 mg at 03/23/19 0806  . metoprolol tartrate (LOPRESSOR) tablet 25 mg  25 mg Oral BID Donne Hazel, MD   25 mg at 03/23/19 0807  . oxyCODONE (Oxy IR/ROXICODONE) immediate release tablet 10 mg  10 mg Oral Q8H PRN Afrika Brick, Myer Peer, MD   10 mg at 03/23/19 0713  . simvastatin (ZOCOR) tablet 40 mg  40 mg Oral Daily Lindon Romp A, NP   40 mg at 03/23/19 0806  . tamsulosin (FLOMAX) capsule 0.4 mg  0.4 mg Oral Daily Lindon Romp A, NP   0.4 mg at 03/23/19 5176    Lab Results:  Results for orders placed or performed during the hospital encounter of 03/20/19 (from the past 48 hour(s))  T3, free     Status: None   Collection Time: 03/22/19  6:41 AM  Result Value Ref Range   T3, Free 3.3 2.0 - 4.4 pg/mL     Comment: (NOTE) Performed At: [facility] [place], [place] [id number] Rush Farmer MD [id number]   T4, free     Status: None   Collection Time: 03/22/19  6:41 AM  Result Value Ref Range   Free T4 0.97 0.61 - 1.12 ng/dL    Comment: (NOTE) Biotin ingestion may interfere with free T4 tests. If the results are inconsistent with  the TSH level, previous test results, or the clinical presentation, then consider biotin interference. If needed, order repeat testing after stopping biotin. Performed at [facility], [place] [address] [place], [place] [address]   TSH     Status: None   Collection Time: 03/22/19  6:41 AM  Result Value Ref Range   TSH 0.874 0.350 - 4.500 uIU/mL    Comment: Performed by a 3rd Generation assay with a functional sensitivity of <=0.01 uIU/mL. Performed at [facility], [place] [address] [place], [place] [address]     Blood Alcohol level:  Lab Results  Component Value Date   ETH <10 09/81/1914    Metabolic Disorder Labs: Lab Results  Component Value Date   HGBA1C 5.9 11/07/2018   No results found for: PROLACTIN Lab Results  Component Value Date   CHOL 131 05/20/2017   TRIG 145 05/20/2017   HDL 47 05/20/2017   LDLCALC 55 05/20/2017    Physical Findings: AIMS: Facial and Oral Movements Muscles of Facial Expression: None, normal Lips and Perioral Area: None, normal Jaw: None, normal Tongue: None, normal,Extremity Movements Upper (arms, wrists, hands, fingers): None, normal Lower (legs, knees, ankles, toes): None, normal, Trunk Movements Neck, shoulders, hips: None, normal, Overall Severity Severity of abnormal movements (highest score from questions above): None, normal Incapacitation due to abnormal movements: None, normal Patient's awareness of abnormal movements (rate only patient's report): No Awareness, Dental Status Current problems with teeth and/or dentures?: No Does patient usually wear  dentures?: No  CIWA:  CIWA-Ar Total: 1 COWS:  COWS Total Score: 1  Musculoskeletal: Strength & Muscle Tone: within normal limits-no psychomotor agitation or restlessness, no significant distal tremors, no diaphoresis Gait & Station: normal Patient leans: N/A  Psychiatric Specialty Exam: Physical Exam  Constitutional: He is oriented to person, place, and time. He appears well-developed and well-nourished.  Respiratory: Effort normal. No respiratory distress.  Neurological: He is alert and oriented to person, place, and time.    ROS denies headache or chest pain, denies shortness of breath, denies cough, no vomiting, no fever, no chills  Blood pressure (!) 156/108, pulse 78, temperature 98.5 F (36.9 C), temperature source Oral, resp. rate 18, height '5\' 4"'  (1.626 m), weight 83.9 kg, SpO2 98 %.Body mass index is 31.75 kg/m.  General Appearance: Guarded  Eye Contact:  Good  Speech:  Normal Rate  Volume:  Normal  Mood:  Reports his mood is "all right"  Affect:  Appropriate, vaguely anxious, not irritable or angry  Thought Process:  Linear and Descriptions of Associations: Intact  Orientation:  Full (Time, Place, and Person)-remains fully alert and attentive, oriented x3  Thought Content:  No hallucinations, no delusions, not internally preoccupied  Suicidal Thoughts:  No denies suicidal or self-injurious ideations, denies homicidal or violent ideations, specifically also denies any violent or homicidal ideations towards wife or any other family members  Homicidal Thoughts:  No  Memory:  Recent and remote grossly intact  Judgement:  Fair  Insight:  Fair  Psychomotor Activity:  Normal  Concentration:  Concentration: Good and Attention Span: Good  Recall:  Good  Fund of Knowledge:  Good  Language:  Good  Akathisia:  Negative  Handed:  Right  AIMS (if indicated):     Assets:  Communication Skills Desire for Improvement Resilience  ADL's:  Intact  Cognition:  WNL  Sleep:  Number  of Hours: 6   Treatment Plan Summary: Daily contact with patient to assess and evaluate symptoms and  progress in treatment, Medication management, Plan Inpatient treatment and Medication as below Encourage group and milieu participation Increase Tegretol to 100 mg TID, titrate as tolerated Continue lisinopril 20 mg daily and Lopressor 25 mg twice daily for hypertension Continue metformin 500 mg twice daily for diabetes Continue simvastatin 40 mg daily for hyperlipidemia Continue oxycodone 10 mg every 8 hours PRN for pain as needed Treatment team working on disposition planning options-of note, patient's wife informed CSW that patient will not be able to return home after discharge.  Rozetta Nunnery, NP 03/23/2019, 10:06 AM   I have reviewed case with treatment team and have met patient along with Corene Cornea, NP.  Patient presents calm, polite on approach, without agitation, irritability or overt anger. He has had no explosive outbursts or disruptive/abnormal behaviors since admission to unit. Describes mood as "OK", denies significant depression or neuro-vegetative symptoms , denies SI and denies any HI or violent ideations towards wife or anyone else.  Tolerating Tegretol trial well, denies side effects. We have again reviewed history- regarding Xanax , states he was weaned off it several months ago, and denies any BZD use since then . Home medication list includes Ambien but patient states was not taking recently. Currently does not present with tremors, agitation or restlessness and appears calm and in no acute distress. Denies medication side effects and has tolerated Tegretol trial well thus far . Side effects reviewed . Patient reports long term management with opiates ( Oxycodone ) and has been tapering down gradually. We recently tapered dose down further to 10 mgrs Q 8 hours PRN- denies side effects other than reporting opiates sometimes interferes with sleep. Denies day time sedation and  appears fully alert and attentive at this time. With patient's express consent I contacted wife for collateral information. She reported she was at work and asked to be contacted later . Patient is aware he will not be able to return home following discharge and plans to stay with other family members. Plan- Disposition planning in progress . Increase Tegretol to 100 mgrs TID to address potential seizure, mood disorder, explosiveness. Side effects reviewed.Continue Lisinopril , Metoprolol for HTN, Glucophage for DM.  Continue Vistaril 25 mgrs Q 8 hours PRN for anxiety as needed . Will check BMP/CBC as on Carbamazepine trial.   Gabriel Earing MD

## 2019-03-23 NOTE — [facility] Group Notes (Signed)
[facility] Group Notes:  (Nursing/MHT/Case Management/Adjunct)  Date:  03/23/2019  Time:  10:00 AM  Type of Therapy:  Nurse Education  Participation Level:  Active  Participation Quality:  Appropriate, Attentive and Sharing  Affect:  Appropriate  Cognitive:  Alert and Appropriate  Insight:  Appropriate, Good and Improving  Engagement in Group:  Developing/Improving, Engaged, Improving, Off Topic and Supportive  Modes of Intervention:  Discussion, Education, Exploration, Socialization and Support  Summary of Progress/Problems: Pt's discussed crisis management and what this meant to them. Pt's discussed the crisis that they presented with, and behaviors that led up to this. Lastly, pt's discussed triggers and positive coping skills that could be utilized if another crisis arises that is felt as insurmountable.  Pt was appropriate and shared multiple times. Pt could become off topic and monopolizing but was redirectable. Pt provided support for others and personal examples for coping skills.  Victor Fields 03/23/2019, 12:05 PM

## 2019-03-24 LAB — BASIC METABOLIC PANEL
Anion gap: 9 (ref 5–15)
BUN: 16 mg/dL (ref 8–23)
CO2: 26 mmol/L (ref 22–32)
Calcium: 9.5 mg/dL (ref 8.9–10.3)
Chloride: 103 mmol/L (ref 98–111)
Creatinine, Ser: 0.92 mg/dL (ref 0.61–1.24)
GFR calc Af Amer: >60 mL/min (ref >60)
GFR calc non Af Amer: >60 mL/min (ref >60)
Glucose, Bld: 166 mg/dL — ABNORMAL HIGH (ref 70–99)
Potassium: 3.6 mmol/L (ref 3.5–5.1)
Sodium: 138 mmol/L (ref 135–145)

## 2019-03-24 LAB — CBC WITH DIFFERENTIAL/PLATELET
Abs Immature Granulocytes: 0.04 10*3/uL (ref 0.00–0.07)
Basophils Absolute: 0.1 10*3/uL (ref 0.0–0.1)
Basophils Relative: 1 %
Eosinophils Absolute: 0 10*3/uL (ref 0.0–0.5)
Eosinophils Relative: 0 %
HCT: 44.4 % (ref 39.0–52.0)
Hemoglobin: 14.9 g/dL (ref 13.0–17.0)
Immature Granulocytes: 0 %
Lymphocytes Relative: 21 %
Lymphs Abs: 2.7 10*3/uL (ref 0.7–4.0)
MCH: 30.9 pg (ref 26.0–34.0)
MCHC: 33.6 g/dL (ref 30.0–36.0)
MCV: 92.1 fL (ref 80.0–100.0)
Monocytes Absolute: 0.9 10*3/uL (ref 0.1–1.0)
Monocytes Relative: 7 %
Neutro Abs: 9.1 10*3/uL — ABNORMAL HIGH (ref 1.7–7.7)
Neutrophils Relative %: 71 %
Platelets: 173 10*3/uL (ref 150–400)
RBC: 4.82 MIL/uL (ref 4.22–5.81)
RDW: 13.2 % (ref 11.5–15.5)
WBC: 12.8 10*3/uL — ABNORMAL HIGH (ref 4.0–10.5)
nRBC: 0 % (ref 0.0–0.2)

## 2019-03-24 MED ORDER — OXYCODONE HCL 5 MG PO TABS
10.0000 mg | ORAL_TABLET | Freq: Two times a day (BID) | ORAL | Status: DC | PRN
  Administered 2019-03-24 – 2019-03-25 (×3): 10 mg via ORAL
  Filled 2019-03-24 (×2): qty 2

## 2019-03-24 MED ORDER — OXYCODONE HCL 5 MG PO TABS
5.0000 mg | ORAL_TABLET | Freq: Every evening | ORAL | Status: DC | PRN
  Administered 2019-03-24: 5 mg via ORAL
  Filled 2019-03-24: qty 1

## 2019-03-24 MED ORDER — OXYCODONE HCL 5 MG PO TABS: 5.0000 mg | ORAL_TABLET | Freq: Every day | ORAL | Status: DC

## 2019-03-24 MED ORDER — OXYCODONE HCL 5 MG PO TABS: 10.0000 mg | ORAL_TABLET | Freq: Two times a day (BID) | ORAL | Status: DC
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
## 2019-03-24 NOTE — Progress Notes (Signed)
Recreation Therapy Notes  Date:  9.18.20 Time: 0930 Location: [address] Dayroom  Group Topic: Stress Management  Goal Area(s) Addresses:  Patient will identify positive stress management techniques. Patient will identify benefits of using stress management post d/c.  Behavioral Response: Engaged  Intervention: Stress Management  Activity :  Guided Imagery.  LRT introduced the stress management technique of guided imagery.  LRT read a script that lead the patients on a journey to the beach.  Patients were to follow along as LRT read script to engage in activity.  Education:  Stress Management, Discharge Planning.   Education Outcome: Acknowledges Education  Clinical Observations/Feedback:  Pt attended and participated in group.      Victorino Sparrow, LRT/CTRS         Ria Comment, Nayden Czajka A 03/24/2019 10:50 AM

## 2019-03-24 NOTE — [facility] Group Notes (Signed)
LCSW Group Therapy Note 03/24/2019 1:48 PM  Type of Therapy and Topic: Group Therapy: Overcoming Obstacles  Participation Level: Did Not Attend  Description of Group:  In this group patients will be encouraged to explore what they see as obstacles to their own wellness and recovery. They will be guided to discuss their thoughts, feelings, and behaviors related to these obstacles. The group will process together ways to cope with barriers, with attention given to specific choices patients can make. Each patient will be challenged to identify changes they are motivated to make in order to overcome their obstacles. This group will be process-oriented, with patients participating in exploration of their own experiences as well as giving and receiving support and challenge from other group members.  Therapeutic Goals: 1. Patient will identify personal and current obstacles as they relate to admission. 2. Patient will identify barriers that currently interfere with their wellness or overcoming obstacles.  3. Patient will identify feelings, thought process and behaviors related to these barriers. 4. Patient will identify two changes they are willing to make to overcome these obstacles:   Summary of Patient Progress  Invited, chose not to attend. Patient was asleep.    Therapeutic Modalities:  Cognitive Behavioral Therapy Solution Focused Therapy Motivational Interviewing Relapse Prevention Therapy   Theresa Duty Clinical Social Worker

## 2019-03-24 NOTE — Progress Notes (Signed)
D: Patient denies SI, HI or AVH. Patient presents as flat and depressed but cooperative.  Pt. Attended evening wrap up group where he reported he was hopeful to discharge tomorrow and stated his goal was the same.   A: Patient given emotional support from RN. Patient encouraged to come to staff with concerns and/or questions. Patient's medication routine continued. Patient's orders and plan of care reviewed.   R: Patient remains appropriate and cooperative. Will continue to monitor patient q15 minutes for safety.

## 2019-03-25 LAB — CARBAMAZEPINE LEVEL, TOTAL: Carbamazepine Lvl: 5.5 ug/mL (ref 4.0–12.0)

## 2019-03-25 MED ORDER — METOPROLOL TARTRATE 25 MG PO TABS: 25.0000 mg | ORAL_TABLET | Freq: Two times a day (BID) | ORAL | 0 refills | Status: AC

## 2019-03-25 MED ORDER — HYDROXYZINE HCL 25 MG PO TABS: 25.0000 mg | ORAL_TABLET | Freq: Three times a day (TID) | ORAL | 0 refills | Status: DC | PRN

## 2019-03-25 MED ORDER — TAMSULOSIN HCL 0.4 MG PO CAPS: 0.4000 mg | ORAL_CAPSULE | Freq: Every day | ORAL | 0 refills | Status: AC

## 2019-03-25 MED ORDER — LISINOPRIL 20 MG PO TABS: 20.0000 mg | ORAL_TABLET | Freq: Every day | ORAL | 0 refills | Status: AC

## 2019-03-25 MED ORDER — CARBAMAZEPINE 100 MG PO CHEW: 100.0000 mg | CHEWABLE_TABLET | Freq: Three times a day (TID) | ORAL | 0 refills | Status: AC

## 2019-03-25 MED ORDER — SIMVASTATIN 40 MG PO TABS: 40.0000 mg | ORAL_TABLET | Freq: Every day | ORAL | 0 refills | Status: AC

## 2019-03-25 MED ORDER — METFORMIN HCL 500 MG PO TABS: 500.0000 mg | ORAL_TABLET | Freq: Two times a day (BID) | ORAL | 0 refills | Status: DC

## 2019-03-25 NOTE — Progress Notes (Signed)
[place] NOVEL CORONAVIRUS (COVID-19) DAILY CHECK-OFF SYMPTOMS - answer yes or no to each - every day NO YES  Have you had a fever in the past 24 hours?  . Fever (Temp > 37.80C / 100F) X   Have you had any of these symptoms in the past 24 hours? . New Cough .  Sore Throat  .  Shortness of Breath .  Difficulty Breathing .  Unexplained Body Aches   X   Have you had any one of these symptoms in the past 24 hours not related to allergies?   . Runny Nose .  Nasal Congestion .  Sneezing   X   If you have had runny nose, nasal congestion, sneezing in the past 24 hours, has it worsened?  X   EXPOSURES - check yes or no X   Have you traveled outside the state in the past 14 days?  X   Have you been in contact with someone with a confirmed diagnosis of COVID-19 or PUI in the past 14 days without wearing appropriate PPE?  X   Have you been living in the same home as a person with confirmed diagnosis of COVID-19 or a PUI (household contact)?    X   Have you been diagnosed with COVID-19?    X              What to do next: Answered NO to all: Answered YES to anything:   Proceed with unit schedule Follow the [facility] Inpatient Flowsheet.   

## 2019-03-25 NOTE — Progress Notes (Signed)
  [facility] Case Management Discharge Plan :  Will you be returning to the same living situation after discharge:  No.  Is going to mother's house instead At discharge, do you have transportation home?: Yes,  family member Do you have the ability to pay for your medications: Yes,  denies barriers  Release of information consent forms completed and turned in to Medical Records by CSW.   Patient to Follow up at: Follow-up Information    Patient declines all outpatient referrals Follow up.   Why: Patient states he will follow up on his own with his psychiatrist for medication management follow up. Wife is aware of this discharge plan.          Next level of care provider has access to [place] and Suicide Prevention discussed: Yes,  with wife     Has patient been referred to the Quitline?: Patient refused referral  Patient has been referred for addiction treatment: N/A  Maretta Los, LCSW 03/25/2019, 11:15 AM

## 2019-03-25 NOTE — [facility] Suicide Risk Assessment (Signed)
[facility] Discharge Suicide Risk Assessment   Principal Problem: <principal problem not specified> Discharge Diagnoses: Active Problems:   Schizoaffective disorder ([place])   Substance induced mood disorder (HCC)   Total Time spent with patient: 20 minutes  Musculoskeletal: Strength & Muscle Tone: within normal limits Gait & Station: shuffle Patient leans: N/A  Psychiatric Specialty Exam: Review of Systems  Musculoskeletal: Positive for joint pain and myalgias.  All other systems reviewed and are negative.   Blood pressure (!) 148/107, pulse 84, temperature (!) 97.3 F (36.3 C), temperature source Oral, resp. rate 18, height 5\' 4"  (1.626 m), weight 83.9 kg, SpO2 98 %.Body mass index is 31.75 kg/m.  General Appearance: Casual  Eye Contact::  Fair  Speech:  Normal Rate409  Volume:  Normal  Mood:  Anxious  Affect:  Congruent  Thought Process:  Coherent and Descriptions of Associations: Intact  Orientation:  Full (Time, Place, and Person)  Thought Content:  Logical  Suicidal Thoughts:  No  Homicidal Thoughts:  No  Memory:  Immediate;   Fair Recent;   Fair Remote;   Fair  Judgement:  Intact  Insight:  Fair  Psychomotor Activity:  Normal  Concentration:  Fair  Recall:  [employer] of Knowledge:Fair  Language: Fair  Akathisia:  Negative  Handed:  Right  AIMS (if indicated):     Assets:  Desire for Improvement Housing Resilience  Sleep:  Number of Hours: 6.75  Cognition: WNL  ADL's:  Intact   Mental Status Per Nursing Assessment::   On Admission:  NA  Demographic Factors:  Male, Caucasian and Unemployed  Loss Factors: NA  Historical Factors: Impulsivity  Risk Reduction Factors:   Sense of responsibility to family, Living with another person, especially a relative and Positive social support  Continued Clinical Symptoms:  Severe Anxiety and/or Agitation Depression:   Impulsivity Alcohol/Substance Abuse/Dependencies Chronic Pain Medical Diagnoses and  Treatments/Surgeries  Cognitive Features That Contribute To Risk:  None    Suicide Risk:  Minimal: No identifiable suicidal ideation.  Patients presenting with no risk factors but with morbid ruminations; may be classified as minimal risk based on the severity of the depressive symptoms  Follow-up Information    Patient declines all outpatient referrals Follow up.   Why: Patient states he will follow up on his own with his psychiatrist for medication managaement follow up.           Plan Of Care/Follow-up recommendations:  Activity:  ad lib  Sharma Covert, MD 03/25/2019, 9:29 AM

## 2019-03-25 NOTE — [facility] Group Notes (Addendum)
LCSW Group Therapy Note  Date/Time:    03/25/2019 10:00-11:00AM  Type of Therapy and Topic:   Group Therapy:  Saying Goodbye to an Unhealthy Coping Skill or Unhealthy Support  Participation Level:  Minimal   Description of Group:  The focus of this group was to determine what unhealthy coping techniques typically are used by group members and why. Patients were guided in becoming aware of the differences between healthy and unhealthy coping techniques.  Facilitator led a discussion about the reasons that unhealthy coping skills are often used, ie. from habit, learning, ease, availability, speed, and more.  Patients then wrote a goodbye letter to whatever they wished, after which group members shared either specifically or generally about what they addressed in their letter.  Questions were asked and answered about what to do when one feels they have given up an unhealthy habit and it comes back.  An emphasis was placed on rephrasing negative thoughts into positive ones, ie., instead of "I am not going to binge eat," saying to oneself, "I am going to eat healthy foods in a healthy amount."  Therapeutic Goals 1. Patients learned that the main similarity between healthy and unhealthy coping techniques is that they all work initially, and the main difference is that the unhealthy ones stop working and/or start hurting. 2. Patients identified some of their own unhealthy habits and/or supports. 3. Patients wrote a goodbye letter to an unhealthy habit or support and shared as much as they wished with the group. 4. Patients provided support and ideas to each other.  Summary of Patient Progress: During group, patient expressed that he has gotten rid of all his unhealthy coping skills, bad habits, and negative supports.  He maintained all through group that the sole reason for his hospitalization was because he took the wrong medicine.  He refused initially to write a letter, then after thinking about it, he  did write one very very quickly but did not want to discuss it.   Therapeutic Modalities Cognitive Behavioral Therapy Motivational Interviewing   Victor Dominion, LCSW 03/25/2019, 1:44 PM

## 2019-03-25 NOTE — Progress Notes (Signed)
Patient has been observed up in the dayroom watching a movie. He reports his day as being good but has had some pain issues today. He was informed of medications available if needed for hs. Safety maintained with 15 min checks.

## 2019-03-25 NOTE — Progress Notes (Signed)
D. Pt is friendly during interactions- observed in the mileu interacting well with peers. Per pt's self inventory, pt rated his depression, hopelessness and anxiety all 0's. Pt writes that his goal today is "going on with life". Pt continues to complain of chronic back pain- which he reports is somewhat relieved after taking pain medication.  Pt currently denies SI/HI and AVH A. Labs and vitals monitored. Pt compliant with medications. Pt supported emotionally and encouraged to express concerns and ask questions.   R. Pt remains safe with 15 minute checks. Will continue POC.
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
## 2019-03-25 NOTE — Progress Notes (Signed)
Pt discharged to lobby. Pt was stable and appreciative at that time. All papers  were given and valuables returned. Verbal understanding expressed. Denies SI/HI and A/VH. Pt given opportunity to express concerns and ask questions. °

## 2019-03-25 NOTE — Progress Notes (Signed)
[facility] Notes:  (Nursing/MHT/Case Management/Adjunct)  Date:  03/25/2019  Time:  1330   Type of Therapy:  Nurse Education  Participation Level:  Did Not Attend, the pt declined to attend group when notified by Probation officer.   Sotero Brinkmeyer L 03/25/2019

## 2019-03-27 DIAGNOSIS — M1991 Primary osteoarthritis, unspecified site: Secondary | ICD-10-CM | POA: Diagnosis not present

## 2019-03-27 DIAGNOSIS — G894 Chronic pain syndrome: Secondary | ICD-10-CM | POA: Diagnosis not present

## 2019-03-27 DIAGNOSIS — I10 Essential (primary) hypertension: Secondary | ICD-10-CM | POA: Diagnosis not present

## 2019-03-27 DIAGNOSIS — E063 Autoimmune thyroiditis: Secondary | ICD-10-CM | POA: Diagnosis not present

## 2019-03-31 DIAGNOSIS — E063 Autoimmune thyroiditis: Secondary | ICD-10-CM | POA: Diagnosis not present

## 2019-03-31 DIAGNOSIS — J449 Chronic obstructive pulmonary disease, unspecified: Secondary | ICD-10-CM | POA: Diagnosis not present

## 2019-04-05 DIAGNOSIS — E7849 Other hyperlipidemia: Secondary | ICD-10-CM | POA: Diagnosis not present

## 2019-04-05 DIAGNOSIS — I10 Essential (primary) hypertension: Secondary | ICD-10-CM | POA: Diagnosis not present

## 2019-04-26 DIAGNOSIS — Z23 Encounter for immunization: Secondary | ICD-10-CM | POA: Diagnosis not present

## 2019-04-26 DIAGNOSIS — G894 Chronic pain syndrome: Secondary | ICD-10-CM | POA: Diagnosis not present

## 2019-04-26 DIAGNOSIS — M159 Polyosteoarthritis, unspecified: Secondary | ICD-10-CM | POA: Diagnosis not present

## 2019-05-06 DIAGNOSIS — E119 Type 2 diabetes mellitus without complications: Secondary | ICD-10-CM | POA: Diagnosis not present

## 2019-05-06 DIAGNOSIS — I10 Essential (primary) hypertension: Secondary | ICD-10-CM | POA: Diagnosis not present

## 2019-05-06 DIAGNOSIS — J449 Chronic obstructive pulmonary disease, unspecified: Secondary | ICD-10-CM | POA: Diagnosis not present

## 2019-05-06 DIAGNOSIS — E782 Mixed hyperlipidemia: Secondary | ICD-10-CM | POA: Diagnosis not present

## 2019-05-12 DIAGNOSIS — I10 Essential (primary) hypertension: Secondary | ICD-10-CM | POA: Diagnosis not present

## 2019-05-12 DIAGNOSIS — M1991 Primary osteoarthritis, unspecified site: Secondary | ICD-10-CM | POA: Diagnosis not present

## 2019-05-12 DIAGNOSIS — J449 Chronic obstructive pulmonary disease, unspecified: Secondary | ICD-10-CM | POA: Diagnosis not present

## 2019-05-12 DIAGNOSIS — E063 Autoimmune thyroiditis: Secondary | ICD-10-CM | POA: Diagnosis not present

## 2019-05-30 ENCOUNTER — Other Ambulatory Visit: Payer: Self-pay

## 2019-05-30 DIAGNOSIS — G894 Chronic pain syndrome: Secondary | ICD-10-CM | POA: Diagnosis not present

## 2019-05-30 NOTE — Patient Outreach (Signed)
[facility]) Care Management  05/30/2019  Victor Fields 11/07/1955 [id number]   Medication Adherence call to Victor Fields HIPPA Compliant Voice message left with a call back number. Victor Fields is showing past due on Metformin 500 mg under [place].   [facility] Direct Dial [phone]  Fax [phone] [email or web address]

## 2019-06-11 DIAGNOSIS — K1379 Other lesions of oral mucosa: Secondary | ICD-10-CM | POA: Diagnosis not present

## 2019-06-11 DIAGNOSIS — T7840XA Allergy, unspecified, initial encounter: Secondary | ICD-10-CM | POA: Diagnosis not present

## 2019-06-12 DIAGNOSIS — Z79899 Other long term (current) drug therapy: Secondary | ICD-10-CM | POA: Diagnosis not present

## 2019-06-12 DIAGNOSIS — Z87891 Personal history of nicotine dependence: Secondary | ICD-10-CM | POA: Diagnosis not present

## 2019-06-12 DIAGNOSIS — T783XXA Angioneurotic edema, initial encounter: Secondary | ICD-10-CM | POA: Diagnosis not present

## 2019-06-12 DIAGNOSIS — E119 Type 2 diabetes mellitus without complications: Secondary | ICD-10-CM | POA: Diagnosis not present

## 2019-06-12 DIAGNOSIS — K1379 Other lesions of oral mucosa: Secondary | ICD-10-CM | POA: Diagnosis not present

## 2019-06-12 DIAGNOSIS — T7840XA Allergy, unspecified, initial encounter: Secondary | ICD-10-CM | POA: Diagnosis not present

## 2019-06-12 DIAGNOSIS — I10 Essential (primary) hypertension: Secondary | ICD-10-CM | POA: Diagnosis not present

## 2019-06-12 DIAGNOSIS — E7800 Pure hypercholesterolemia, unspecified: Secondary | ICD-10-CM | POA: Diagnosis not present

## 2019-06-12 DIAGNOSIS — Z7984 Long term (current) use of oral hypoglycemic drugs: Secondary | ICD-10-CM | POA: Diagnosis not present

## 2019-06-27 DIAGNOSIS — G894 Chronic pain syndrome: Secondary | ICD-10-CM | POA: Diagnosis not present

## 2019-07-03 ENCOUNTER — Ambulatory Visit: Payer: Medicare Other | Admitting: "Endocrinology

## 2019-07-26 DIAGNOSIS — M1991 Primary osteoarthritis, unspecified site: Secondary | ICD-10-CM | POA: Diagnosis not present

## 2019-07-26 DIAGNOSIS — G894 Chronic pain syndrome: Secondary | ICD-10-CM | POA: Diagnosis not present

## 2019-08-23 DIAGNOSIS — G894 Chronic pain syndrome: Secondary | ICD-10-CM | POA: Diagnosis not present

## 2019-08-23 DIAGNOSIS — M4802 Spinal stenosis, cervical region: Secondary | ICD-10-CM | POA: Diagnosis not present

## 2019-09-03 DIAGNOSIS — I099 Rheumatic heart disease, unspecified: Secondary | ICD-10-CM | POA: Diagnosis not present

## 2019-09-21 DIAGNOSIS — G894 Chronic pain syndrome: Secondary | ICD-10-CM | POA: Diagnosis not present

## 2019-09-21 DIAGNOSIS — M1991 Primary osteoarthritis, unspecified site: Secondary | ICD-10-CM | POA: Diagnosis not present

## 2019-10-04 DIAGNOSIS — I10 Essential (primary) hypertension: Secondary | ICD-10-CM | POA: Diagnosis not present

## 2019-10-19 DIAGNOSIS — G894 Chronic pain syndrome: Secondary | ICD-10-CM | POA: Diagnosis not present

## 2019-11-21 ENCOUNTER — Other Ambulatory Visit (HOSPITAL_COMMUNITY): Payer: Self-pay | Admitting: Physician Assistant

## 2019-11-21 DIAGNOSIS — E119 Type 2 diabetes mellitus without complications: Secondary | ICD-10-CM | POA: Diagnosis not present

## 2019-11-21 DIAGNOSIS — E063 Autoimmune thyroiditis: Secondary | ICD-10-CM | POA: Diagnosis not present

## 2019-11-21 DIAGNOSIS — E7849 Other hyperlipidemia: Secondary | ICD-10-CM | POA: Diagnosis not present

## 2019-11-21 DIAGNOSIS — Z1389 Encounter for screening for other disorder: Secondary | ICD-10-CM | POA: Diagnosis not present

## 2019-11-21 DIAGNOSIS — E785 Hyperlipidemia, unspecified: Secondary | ICD-10-CM | POA: Diagnosis not present

## 2019-11-21 DIAGNOSIS — J449 Chronic obstructive pulmonary disease, unspecified: Secondary | ICD-10-CM | POA: Diagnosis not present

## 2019-11-21 DIAGNOSIS — M542 Cervicalgia: Secondary | ICD-10-CM

## 2019-11-21 DIAGNOSIS — I10 Essential (primary) hypertension: Secondary | ICD-10-CM | POA: Diagnosis not present

## 2019-11-21 DIAGNOSIS — Z0000 Encounter for general adult medical examination without abnormal findings: Secondary | ICD-10-CM | POA: Diagnosis not present

## 2019-11-22 ENCOUNTER — Ambulatory Visit (HOSPITAL_COMMUNITY)
Admission: RE | Admit: 2019-11-22 | Discharge: 2019-11-22 | Disposition: A | Discharge status: Home / Self Care | Payer: Medicare Other | Source: Ambulatory Visit | Attending: Physician Assistant | Admitting: Physician Assistant

## 2019-11-22 ENCOUNTER — Other Ambulatory Visit: Payer: Self-pay

## 2019-11-22 DIAGNOSIS — M542 Cervicalgia: Secondary | ICD-10-CM | POA: Diagnosis not present

## 2019-11-22 DIAGNOSIS — M47812 Spondylosis without myelopathy or radiculopathy, cervical region: Secondary | ICD-10-CM | POA: Diagnosis not present

## 2019-12-25 DIAGNOSIS — G894 Chronic pain syndrome: Secondary | ICD-10-CM | POA: Diagnosis not present

## 2020-01-04 DIAGNOSIS — K7460 Unspecified cirrhosis of liver: Secondary | ICD-10-CM

## 2020-01-04 HISTORY — DX: Unspecified cirrhosis of liver: K74.60

## 2020-01-18 DIAGNOSIS — M159 Polyosteoarthritis, unspecified: Secondary | ICD-10-CM | POA: Diagnosis not present

## 2020-01-18 DIAGNOSIS — G894 Chronic pain syndrome: Secondary | ICD-10-CM | POA: Diagnosis not present

## 2020-01-18 DIAGNOSIS — J449 Chronic obstructive pulmonary disease, unspecified: Secondary | ICD-10-CM | POA: Diagnosis not present

## 2020-01-20 ENCOUNTER — Emergency Department (HOSPITAL_COMMUNITY): Payer: Medicare Other

## 2020-01-20 ENCOUNTER — Other Ambulatory Visit: Payer: Self-pay

## 2020-01-20 ENCOUNTER — Emergency Department (HOSPITAL_COMMUNITY)
Admission: EM | Admit: 2020-01-20 | Discharge: 2020-01-20 | Disposition: A | Discharge status: Home / Self Care | Payer: Medicare Other | Attending: Emergency Medicine | Admitting: Emergency Medicine

## 2020-01-20 ENCOUNTER — Encounter (HOSPITAL_COMMUNITY): Payer: Self-pay | Admitting: Emergency Medicine

## 2020-01-20 DIAGNOSIS — F10929 Alcohol use, unspecified with intoxication, unspecified: Secondary | ICD-10-CM | POA: Insufficient documentation

## 2020-01-20 DIAGNOSIS — E119 Type 2 diabetes mellitus without complications: Secondary | ICD-10-CM | POA: Diagnosis not present

## 2020-01-20 DIAGNOSIS — G8929 Other chronic pain: Secondary | ICD-10-CM | POA: Diagnosis not present

## 2020-01-20 DIAGNOSIS — S0031XA Abrasion of nose, initial encounter: Secondary | ICD-10-CM | POA: Diagnosis not present

## 2020-01-20 DIAGNOSIS — S199XXA Unspecified injury of neck, initial encounter: Secondary | ICD-10-CM | POA: Diagnosis not present

## 2020-01-20 DIAGNOSIS — S60221A Contusion of right hand, initial encounter: Secondary | ICD-10-CM | POA: Diagnosis not present

## 2020-01-20 DIAGNOSIS — Y9389 Activity, other specified: Secondary | ICD-10-CM | POA: Insufficient documentation

## 2020-01-20 DIAGNOSIS — W260XXA Contact with knife, initial encounter: Secondary | ICD-10-CM | POA: Insufficient documentation

## 2020-01-20 DIAGNOSIS — Z0283 Encounter for blood-alcohol and blood-drug test: Secondary | ICD-10-CM | POA: Diagnosis present

## 2020-01-20 DIAGNOSIS — Y999 Unspecified external cause status: Secondary | ICD-10-CM | POA: Insufficient documentation

## 2020-01-20 DIAGNOSIS — Z7984 Long term (current) use of oral hypoglycemic drugs: Secondary | ICD-10-CM | POA: Diagnosis not present

## 2020-01-20 DIAGNOSIS — I10 Essential (primary) hypertension: Secondary | ICD-10-CM | POA: Insufficient documentation

## 2020-01-20 DIAGNOSIS — Y92019 Unspecified place in single-family (private) house as the place of occurrence of the external cause: Secondary | ICD-10-CM | POA: Insufficient documentation

## 2020-01-20 DIAGNOSIS — S60511A Abrasion of right hand, initial encounter: Secondary | ICD-10-CM | POA: Insufficient documentation

## 2020-01-20 DIAGNOSIS — R079 Chest pain, unspecified: Secondary | ICD-10-CM | POA: Diagnosis not present

## 2020-01-20 DIAGNOSIS — Z79899 Other long term (current) drug therapy: Secondary | ICD-10-CM | POA: Insufficient documentation

## 2020-01-20 DIAGNOSIS — S3991XA Unspecified injury of abdomen, initial encounter: Secondary | ICD-10-CM | POA: Insufficient documentation

## 2020-01-20 DIAGNOSIS — M25559 Pain in unspecified hip: Secondary | ICD-10-CM | POA: Insufficient documentation

## 2020-01-20 DIAGNOSIS — Z87891 Personal history of nicotine dependence: Secondary | ICD-10-CM | POA: Insufficient documentation

## 2020-01-20 DIAGNOSIS — S0121XA Laceration without foreign body of nose, initial encounter: Secondary | ICD-10-CM | POA: Diagnosis not present

## 2020-01-20 DIAGNOSIS — N179 Acute kidney failure, unspecified: Secondary | ICD-10-CM | POA: Diagnosis not present

## 2020-01-20 DIAGNOSIS — F10920 Alcohol use, unspecified with intoxication, uncomplicated: Secondary | ICD-10-CM

## 2020-01-20 DIAGNOSIS — S0990XA Unspecified injury of head, initial encounter: Secondary | ICD-10-CM | POA: Diagnosis not present

## 2020-01-20 DIAGNOSIS — S0993XA Unspecified injury of face, initial encounter: Secondary | ICD-10-CM | POA: Diagnosis not present

## 2020-01-20 DIAGNOSIS — S299XXA Unspecified injury of thorax, initial encounter: Secondary | ICD-10-CM | POA: Insufficient documentation

## 2020-01-20 LAB — CBC WITH DIFFERENTIAL/PLATELET
Abs Immature Granulocytes: 0.09 10*3/uL — ABNORMAL HIGH (ref 0.00–0.07)
Basophils Absolute: 0.1 10*3/uL (ref 0.0–0.1)
Basophils Relative: 1 %
Eosinophils Absolute: 0.5 10*3/uL (ref 0.0–0.5)
Eosinophils Relative: 3 %
HCT: 41.5 % (ref 39.0–52.0)
Hemoglobin: 14.2 g/dL (ref 13.0–17.0)
Immature Granulocytes: 1 %
Lymphocytes Relative: 14 %
Lymphs Abs: 2.4 10*3/uL (ref 0.7–4.0)
MCH: 32.4 pg (ref 26.0–34.0)
MCHC: 34.2 g/dL (ref 30.0–36.0)
MCV: 94.7 fL (ref 80.0–100.0)
Monocytes Absolute: 1.3 10*3/uL — ABNORMAL HIGH (ref 0.1–1.0)
Monocytes Relative: 8 %
Neutro Abs: 12.1 10*3/uL — ABNORMAL HIGH (ref 1.7–7.7)
Neutrophils Relative %: 73 %
Platelets: 171 10*3/uL (ref 150–400)
RBC: 4.38 MIL/uL (ref 4.22–5.81)
RDW: 12.9 % (ref 11.5–15.5)
WBC: 16.4 10*3/uL — ABNORMAL HIGH (ref 4.0–10.5)
nRBC: 0 % (ref 0.0–0.2)

## 2020-01-20 LAB — BASIC METABOLIC PANEL
Anion gap: 15 (ref 5–15)
BUN: 9 mg/dL (ref 8–23)
CO2: 20 mmol/L — ABNORMAL LOW (ref 22–32)
Calcium: 9.4 mg/dL (ref 8.9–10.3)
Chloride: 102 mmol/L (ref 98–111)
Creatinine, Ser: 1.72 mg/dL — ABNORMAL HIGH (ref 0.61–1.24)
GFR calc Af Amer: 48 mL/min — ABNORMAL LOW (ref >60)
GFR calc non Af Amer: 41 mL/min — ABNORMAL LOW (ref >60)
Glucose, Bld: 109 mg/dL — ABNORMAL HIGH (ref 70–99)
Potassium: 3.7 mmol/L (ref 3.5–5.1)
Sodium: 137 mmol/L (ref 135–145)

## 2020-01-20 LAB — ETHANOL: Alcohol, Ethyl (B): 148 mg/dL — ABNORMAL HIGH (ref <10)

## 2020-01-20 MED ORDER — SODIUM CHLORIDE 0.9 % IV BOLUS
500.0000 mL | Freq: Once | INTRAVENOUS | Status: AC
  Administered 2020-01-20: 500 mL via INTRAVENOUS

## 2020-01-20 MED ORDER — TETANUS-DIPHTH-ACELL PERTUSSIS 5-2.5-18.5 LF-MCG/0.5 IM SUSP
0.5000 mL | Freq: Once | INTRAMUSCULAR | Status: AC
  Administered 2020-01-20: 0.5 mL via INTRAMUSCULAR
  Filled 2020-01-20: qty 0.5

## 2020-01-20 MED ORDER — LORAZEPAM 1 MG PO TABS
1.0000 mg | ORAL_TABLET | Freq: Once | ORAL | Status: AC
  Administered 2020-01-20: 1 mg via ORAL
  Filled 2020-01-20: qty 1

## 2020-01-20 MED ORDER — ZIPRASIDONE MESYLATE 20 MG IM SOLR: 10.0000 mg | Freq: Once | INTRAMUSCULAR | Status: DC

## 2020-01-20 MED ORDER — OXYCODONE HCL 5 MG PO TABS
15.0000 mg | ORAL_TABLET | Freq: Once | ORAL | Status: AC
  Administered 2020-01-20: 15 mg via ORAL
  Filled 2020-01-20: qty 3

## 2020-01-20 MED ORDER — IOHEXOL 300 MG/ML  SOLN
80.0000 mL | Freq: Once | INTRAMUSCULAR | Status: AC | PRN
  Administered 2020-01-20: 80 mL via INTRAVENOUS
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
## 2020-01-20 NOTE — Discharge Instructions (Addendum)
Your x-rays and CT scans are negative for serious injury.  Kidney function is slightly elevated today.  Keep yourself hydrated.  Has had a recheck of this kidney function by your doctor this week.  Keep your wounds clean and dry.  Follow-up with your doctor.  Reduce your alcohol intake.  Return to the ED if you develop new or worsening symptoms.

## 2020-01-20 NOTE — ED Notes (Signed)
Cleaned pt's wounds, no dressing applied at this time.

## 2020-03-25 DIAGNOSIS — M159 Polyosteoarthritis, unspecified: Secondary | ICD-10-CM | POA: Diagnosis not present

## 2020-03-25 DIAGNOSIS — G894 Chronic pain syndrome: Secondary | ICD-10-CM | POA: Diagnosis not present

## 2020-04-23 DIAGNOSIS — L0390 Cellulitis, unspecified: Secondary | ICD-10-CM | POA: Diagnosis not present

## 2020-04-23 DIAGNOSIS — G894 Chronic pain syndrome: Secondary | ICD-10-CM | POA: Diagnosis not present

## 2020-05-21 DIAGNOSIS — G894 Chronic pain syndrome: Secondary | ICD-10-CM | POA: Diagnosis not present

## 2020-05-21 DIAGNOSIS — J449 Chronic obstructive pulmonary disease, unspecified: Secondary | ICD-10-CM | POA: Diagnosis not present

## 2020-05-25 DIAGNOSIS — Z1211 Encounter for screening for malignant neoplasm of colon: Secondary | ICD-10-CM | POA: Diagnosis not present

## 2020-06-04 DIAGNOSIS — I10 Essential (primary) hypertension: Secondary | ICD-10-CM | POA: Diagnosis not present

## 2020-06-19 DIAGNOSIS — G894 Chronic pain syndrome: Secondary | ICD-10-CM | POA: Diagnosis not present

## 2020-06-19 DIAGNOSIS — J449 Chronic obstructive pulmonary disease, unspecified: Secondary | ICD-10-CM | POA: Diagnosis not present

## 2020-07-05 DIAGNOSIS — I10 Essential (primary) hypertension: Secondary | ICD-10-CM | POA: Diagnosis not present

## 2020-07-17 DIAGNOSIS — M159 Polyosteoarthritis, unspecified: Secondary | ICD-10-CM | POA: Diagnosis not present

## 2020-07-17 DIAGNOSIS — G894 Chronic pain syndrome: Secondary | ICD-10-CM | POA: Diagnosis not present

## 2020-07-17 DIAGNOSIS — J449 Chronic obstructive pulmonary disease, unspecified: Secondary | ICD-10-CM | POA: Diagnosis not present

## 2020-07-17 DIAGNOSIS — Z1389 Encounter for screening for other disorder: Secondary | ICD-10-CM | POA: Diagnosis not present

## 2020-07-17 DIAGNOSIS — Z0000 Encounter for general adult medical examination without abnormal findings: Secondary | ICD-10-CM | POA: Diagnosis not present

## 2020-07-17 DIAGNOSIS — Z0001 Encounter for general adult medical examination with abnormal findings: Secondary | ICD-10-CM | POA: Diagnosis not present

## 2020-07-17 DIAGNOSIS — R7309 Other abnormal glucose: Secondary | ICD-10-CM | POA: Diagnosis not present

## 2020-07-17 DIAGNOSIS — E119 Type 2 diabetes mellitus without complications: Secondary | ICD-10-CM | POA: Diagnosis not present

## 2020-07-17 DIAGNOSIS — R739 Hyperglycemia, unspecified: Secondary | ICD-10-CM | POA: Diagnosis not present

## 2020-08-03 DIAGNOSIS — I10 Essential (primary) hypertension: Secondary | ICD-10-CM | POA: Diagnosis not present

## 2020-08-19 DIAGNOSIS — R945 Abnormal results of liver function studies: Secondary | ICD-10-CM | POA: Diagnosis not present

## 2020-08-19 DIAGNOSIS — G894 Chronic pain syndrome: Secondary | ICD-10-CM | POA: Diagnosis not present

## 2020-08-19 DIAGNOSIS — R748 Abnormal levels of other serum enzymes: Secondary | ICD-10-CM | POA: Diagnosis not present

## 2020-08-21 ENCOUNTER — Other Ambulatory Visit (HOSPITAL_COMMUNITY): Payer: Self-pay | Admitting: Physician Assistant

## 2020-08-21 ENCOUNTER — Other Ambulatory Visit: Payer: Self-pay | Admitting: Physician Assistant

## 2020-08-21 DIAGNOSIS — R748 Abnormal levels of other serum enzymes: Secondary | ICD-10-CM

## 2020-08-29 ENCOUNTER — Ambulatory Visit (HOSPITAL_COMMUNITY)
Admission: RE | Admit: 2020-08-29 | Discharge: 2020-08-29 | Disposition: A | Discharge status: Home / Self Care | Payer: Medicare Other | Source: Ambulatory Visit | Attending: Physician Assistant | Admitting: Physician Assistant

## 2020-08-29 ENCOUNTER — Other Ambulatory Visit: Payer: Self-pay

## 2020-08-29 DIAGNOSIS — R748 Abnormal levels of other serum enzymes: Secondary | ICD-10-CM | POA: Diagnosis not present

## 2020-08-29 DIAGNOSIS — K7460 Unspecified cirrhosis of liver: Secondary | ICD-10-CM | POA: Diagnosis not present

## 2020-08-29 DIAGNOSIS — K7689 Other specified diseases of liver: Secondary | ICD-10-CM | POA: Diagnosis not present

## 2020-09-09 ENCOUNTER — Encounter: Payer: Self-pay | Admitting: Internal Medicine

## 2020-09-12 DIAGNOSIS — M159 Polyosteoarthritis, unspecified: Secondary | ICD-10-CM | POA: Diagnosis not present

## 2020-09-12 DIAGNOSIS — M1991 Primary osteoarthritis, unspecified site: Secondary | ICD-10-CM | POA: Diagnosis not present

## 2020-09-12 DIAGNOSIS — G894 Chronic pain syndrome: Secondary | ICD-10-CM | POA: Diagnosis not present

## 2020-10-10 ENCOUNTER — Other Ambulatory Visit: Payer: Self-pay

## 2020-10-10 ENCOUNTER — Telehealth: Payer: Self-pay | Admitting: *Deleted

## 2020-10-10 ENCOUNTER — Encounter: Payer: Self-pay | Admitting: *Deleted

## 2020-10-10 ENCOUNTER — Ambulatory Visit: Payer: Medicare Other | Admitting: Internal Medicine

## 2020-10-10 ENCOUNTER — Encounter: Payer: Self-pay | Admitting: Internal Medicine

## 2020-10-10 DIAGNOSIS — K7460 Unspecified cirrhosis of liver: Secondary | ICD-10-CM | POA: Insufficient documentation

## 2020-10-10 DIAGNOSIS — Z7289 Other problems related to lifestyle: Secondary | ICD-10-CM | POA: Diagnosis not present

## 2020-10-10 DIAGNOSIS — F1090 Alcohol use, unspecified, uncomplicated: Secondary | ICD-10-CM

## 2020-10-10 NOTE — Progress Notes (Signed)
  Primary Care Physician:  Fusco, Lawrence, MD Primary Gastroenterologist:  Dr. Birl Lobello  Chief Complaint  Patient presents with  . elevated lft's  . Cirrhosis    HPI:   Victor Fields is a 65 y.o. male who presents to the clinic today as a new patient by referral from his PCP Dr. Fusco for evaluation for cirrhosis.  Appears patient initially was found to have cirrhosis July 2021 after an ER visit for trauma.  Subsequent CT imaging (which I personally reviewed) showed a nodular contour of his liver concerning for cirrhosis, no findings of portal hypertension identified.  He also recently underwent ultrasound 08/29/2020 that showed cirrhotic appearing liver, no ascites, no hepatic lesions.    Patient denies any family history of liver disease.  He does note a long history of alcohol use drinking 5-6 beers a day for many years.  Also a chronic smoker of cigarettes.  Denies any IV drug use.  No history of viral hepatitis that he knows of.  Patient denies any abdominal swelling or lower extremity edema.  No chronic fatigue or confusion.  No melena hematochezia.  States he had a Cologuard performed last month which was negative.  Most recent blood work on 08/19/2020 showed AST 15, ALT 14, T bili 0.3, alk phos 111, no INR.    Past Medical History:  Diagnosis Date  . Back pain   . Elevated cholesterol   . Enlarged prostate   . Hypertension     Past Surgical History:  Procedure Laterality Date  . crush injuries    . LUNG SURGERY      Current Outpatient Medications  Medication Sig Dispense Refill  . amLODipine (NORVASC) 10 MG tablet Take 1 tablet by mouth daily.    . Budeson-Glycopyrrol-Formoterol (BREZTRI AEROSPHERE) 160-9-4.8 MCG/ACT AERO Inhale 2 puffs into the lungs 2 (two) times daily.    . carbamazepine (TEGRETOL) 100 MG chewable tablet Chew 1 tablet (100 mg total) by mouth 3 (three) times daily. (Patient taking differently: Chew 100 mg by mouth 2 (two) times daily.) 90 tablet 0  .  lisinopril (ZESTRIL) 40 MG tablet Take 1 tablet by mouth daily.    . meloxicam (MOBIC) 15 MG tablet Take 1 tablet by mouth daily.    . metoprolol tartrate (LOPRESSOR) 25 MG tablet Take 1 tablet (25 mg total) by mouth 2 (two) times daily. 60 tablet 0  . oxyCODONE (ROXICODONE) 15 MG immediate release tablet Take 1 tablet by mouth every 4 (four) hours.    . simvastatin (ZOCOR) 40 MG tablet Take 1 tablet (40 mg total) by mouth daily. 30 tablet 0  . tamsulosin (FLOMAX) 0.4 MG CAPS capsule Take 1 capsule (0.4 mg total) by mouth daily. 30 capsule 0  . hydrOXYzine (ATARAX/VISTARIL) 25 MG tablet Take 1 tablet (25 mg total) by mouth 3 (three) times daily as needed for anxiety. (Patient not taking: Reported on 10/10/2020) 30 tablet 0  . lisinopril (ZESTRIL) 20 MG tablet Take 1 tablet (20 mg total) by mouth daily. (Patient not taking: Reported on 10/10/2020) 30 tablet 0  . metFORMIN (GLUCOPHAGE) 500 MG tablet Take 1 tablet (500 mg total) by mouth 2 (two) times daily with a meal. (Patient not taking: Reported on 10/10/2020) 60 tablet 0   No current facility-administered medications for this visit.    Allergies as of 10/10/2020 - Review Complete 10/10/2020  Allergen Reaction Noted  . Neurontin [gabapentin] Other (See Comments) 08/24/2014    Family History  Problem Relation Age of Onset  .   CAD Father   . Cancer Mother     Social History   Socioeconomic History  . Marital status: Married    Spouse name: Not on file  . Number of children: Not on file  . Years of education: Not on file  . Highest education level: Not on file  Occupational History  . Not on file  Tobacco Use  . Smoking status: Current Every Day Smoker    Types: Cigarettes  . Smokeless tobacco: Never Used  Vaping Use  . Vaping Use: Never used  Substance and Sexual Activity  . Alcohol use: Yes    Comment: occ  . Drug use: Yes    Types: Marijuana  . Sexual activity: Not on file  Other Topics Concern  . Not on file  Social History  Narrative      Used to be a Driver   Used to smoke until 2012   Used to drink heavily a long time ago   Prior drug use   Current wife is 2nd wife-married 2007   Finished high school-did some college   Social Determinants of Health   Financial Resource Strain: Not on file  Food Insecurity: Not on file  Transportation Needs: Not on file  Physical Activity: Not on file  Stress: Not on file  Social Connections: Not on file  Intimate Partner Violence: Not on file    Subjective: Review of Systems  Constitutional: Negative for chills and fever.  HENT: Negative for congestion and hearing loss.   Eyes: Negative for blurred vision and double vision.  Respiratory: Negative for cough and shortness of breath.   Cardiovascular: Negative for chest pain and palpitations.  Gastrointestinal: Negative for abdominal pain, blood in stool, constipation, diarrhea, heartburn, melena and vomiting.  Genitourinary: Negative for dysuria and urgency.  Musculoskeletal: Negative for joint pain and myalgias.  Skin: Negative for itching and rash.  Neurological: Negative for dizziness and headaches.  Psychiatric/Behavioral: Negative for depression. The patient is not nervous/anxious.        Objective: BP 122/78   Pulse (!) 59   Temp (!) 97.5 F (36.4 C) (Temporal)   Ht 5' 4" (1.626 m)   Wt 178 lb (80.7 kg)   BMI 30.55 kg/m  Physical Exam Constitutional:      Appearance: Normal appearance.  HENT:     Head: Normocephalic and atraumatic.  Eyes:     Extraocular Movements: Extraocular movements intact.     Conjunctiva/sclera: Conjunctivae normal.  Cardiovascular:     Rate and Rhythm: Normal rate and regular rhythm.  Pulmonary:     Effort: Pulmonary effort is normal.     Breath sounds: Normal breath sounds.  Abdominal:     General: Bowel sounds are normal.     Palpations: Abdomen is soft.  Musculoskeletal:        General: Normal range of motion.     Cervical back: Normal range of motion and  neck supple.  Skin:    General: Skin is warm.  Neurological:     General: No focal deficit present.     Mental Status: He is alert and oriented to person, place, and time.  Psychiatric:        Mood and Affect: Mood normal.        Behavior: Behavior normal.      Assessment: *Cirrhosis-etiology likely alcohol, well compensated *Chronic alcohol use  Plan: Discussed cirrhosis in depth with patient today.  Etiology likely alcohol induced though we will need to perform full serological   work-up to rule out other causes, which we will do today.  Will also schedule for EGD for variceal screening. The risks including infection, bleed, or perforation as well as benefits, limitations, alternatives and imponderables have been reviewed with the patient. Potential for esophageal dilation, biopsy, etc. have also been reviewed.  Questions have been answered. All parties agreeable.  Ultrasound 08/29/2020 without hepatic lesions.  We will repeat 02/2021 along with AFP.  Counseled patient on the vast importance of alcohol cessation going forward.  Also counseled on the importance of tobacco cessation.  Patient to follow-up after EGD.  Thank you Dr. Fusco for the kind referral.   10/10/2020 3:13 PM   Disclaimer: This note was dictated with voice recognition software. Similar sounding words can inadvertently be transcribed and may not be corrected upon review.  

## 2020-10-10 NOTE — Patient Instructions (Signed)
We will check blood work today to further evaluate the cause of your cirrhosis.  We will also schedule you for upper endoscopy to evaluate for esophageal varices which are blood vessels in your esophagus related to cirrhosis.    Follow-up after procedure to go over all results.  At [facility] we value your feedback. You may receive a survey about your visit today. Please share your experience as we strive to create trusting relationships with our patients to provide genuine, compassionate, quality care.  We appreciate your understanding and patience as we review any laboratory studies, imaging, and other diagnostic tests that are ordered as we care for you. Our office policy is 5 business days for review of these results, and any emergent or urgent results are addressed in a timely manner for your best interest. If you do not hear from our office in 1 week, please contact us.   We also encourage the use of MyChart, which contains your medical information for your review as well. If you are not enrolled in this feature, an access code is on this after visit summary for your convenience. Thank you for allowing [place] to be involved in your care.  It was great to see you today!  I hope you have a great rest of your spring!!    Elon Alas. Abbey Chatters, D.O. Gastroenterology and Hepatology [facility]

## 2020-10-10 NOTE — H&P (View-Only) (Signed)
Primary Care Physician:  Redmond School, MD Primary Gastroenterologist:  Dr. Abbey Chatters  Chief Complaint  Patient presents with  . elevated lft's  . Cirrhosis    HPI:   Victor Fields is a 65 y.o. male who presents to the clinic today as a new patient by referral from his PCP Dr. Gerarda Fraction for evaluation for cirrhosis.  Appears patient initially was found to have cirrhosis July 2021 after an ER visit for trauma.  Subsequent CT imaging (which I personally reviewed) showed a nodular contour of his liver concerning for cirrhosis, no findings of portal hypertension identified.  He also recently underwent ultrasound 08/29/2020 that showed cirrhotic appearing liver, no ascites, no hepatic lesions.    Patient denies any family history of liver disease.  He does note a long history of alcohol use drinking 5-6 beers a day for many years.  Also a chronic smoker of cigarettes.  Denies any IV drug use.  No history of viral hepatitis that he knows of.  Patient denies any abdominal swelling or lower extremity edema.  No chronic fatigue or confusion.  No melena hematochezia.  States he had a Cologuard performed last month which was negative.  Most recent blood work on 08/19/2020 showed AST 15, ALT 14, T bili 0.3, alk phos 111, no INR.    Past Medical History:  Diagnosis Date  . Back pain   . Elevated cholesterol   . Enlarged prostate   . Hypertension     Past Surgical History:  Procedure Laterality Date  . crush injuries    . LUNG SURGERY      Current Outpatient Medications  Medication Sig Dispense Refill  . amLODipine (NORVASC) 10 MG tablet Take 1 tablet by mouth daily.    . Budeson-Glycopyrrol-Formoterol (BREZTRI AEROSPHERE) 160-9-4.8 MCG/ACT AERO Inhale 2 puffs into the lungs 2 (two) times daily.    . carbamazepine (TEGRETOL) 100 MG chewable tablet Chew 1 tablet (100 mg total) by mouth 3 (three) times daily. (Patient taking differently: Chew 100 mg by mouth 2 (two) times daily.) 90 tablet 0  .  lisinopril (ZESTRIL) 40 MG tablet Take 1 tablet by mouth daily.    . meloxicam (MOBIC) 15 MG tablet Take 1 tablet by mouth daily.    . metoprolol tartrate (LOPRESSOR) 25 MG tablet Take 1 tablet (25 mg total) by mouth 2 (two) times daily. 60 tablet 0  . oxyCODONE (ROXICODONE) 15 MG immediate release tablet Take 1 tablet by mouth every 4 (four) hours.    . simvastatin (ZOCOR) 40 MG tablet Take 1 tablet (40 mg total) by mouth daily. 30 tablet 0  . tamsulosin (FLOMAX) 0.4 MG CAPS capsule Take 1 capsule (0.4 mg total) by mouth daily. 30 capsule 0  . hydrOXYzine (ATARAX/VISTARIL) 25 MG tablet Take 1 tablet (25 mg total) by mouth 3 (three) times daily as needed for anxiety. (Patient not taking: Reported on 10/10/2020) 30 tablet 0  . lisinopril (ZESTRIL) 20 MG tablet Take 1 tablet (20 mg total) by mouth daily. (Patient not taking: Reported on 10/10/2020) 30 tablet 0  . metFORMIN (GLUCOPHAGE) 500 MG tablet Take 1 tablet (500 mg total) by mouth 2 (two) times daily with a meal. (Patient not taking: Reported on 10/10/2020) 60 tablet 0   No current facility-administered medications for this visit.    Allergies as of 10/10/2020 - Review Complete 10/10/2020  Allergen Reaction Noted  . Neurontin [gabapentin] Other (See Comments) 08/24/2014    Family History  Problem Relation Age of Onset  .  CAD Father   . Cancer Mother     Social History   Socioeconomic History  . Marital status: Married    Spouse name: Not on file  . Number of children: Not on file  . Years of education: Not on file  . Highest education level: Not on file  Occupational History  . Not on file  Tobacco Use  . Smoking status: Current Every Day Smoker    Types: Cigarettes  . Smokeless tobacco: Never Used  Vaping Use  . Vaping Use: Never used  Substance and Sexual Activity  . Alcohol use: Yes    Comment: occ  . Drug use: Yes    Types: Marijuana  . Sexual activity: Not on file  Other Topics Concern  . Not on file  Social History  Narrative      Used to be a Geophysicist/field seismologist   Used to smoke until 2012   Used to drink heavily a long time ago   Prior drug use   Current wife is 2nd wife-married 2007   Finished high school-did some college   Social Determinants of Radio broadcast assistant Strain: Not on [employer] Insecurity: Not on file  Transportation Needs: Not on file  Physical Activity: Not on file  Stress: Not on file  Social Connections: Not on file  Intimate Partner Violence: Not on file    Subjective: Review of Systems  Constitutional: Negative for chills and fever.  HENT: Negative for congestion and hearing loss.   Eyes: Negative for blurred vision and double vision.  Respiratory: Negative for cough and shortness of breath.   Cardiovascular: Negative for chest pain and palpitations.  Gastrointestinal: Negative for abdominal pain, blood in stool, constipation, diarrhea, heartburn, melena and vomiting.  Genitourinary: Negative for dysuria and urgency.  Musculoskeletal: Negative for joint pain and myalgias.  Skin: Negative for itching and rash.  Neurological: Negative for dizziness and headaches.  Psychiatric/Behavioral: Negative for depression. The patient is not nervous/anxious.        Objective: BP 122/78   Pulse (!) 59   Temp (!) 97.5 F (36.4 C) (Temporal)   Ht _0  (1.626 m)   Wt 178 lb (80.7 kg)   BMI 30.55 kg/m  Physical Exam Constitutional:      Appearance: Normal appearance.  HENT:     Head: Normocephalic and atraumatic.  Eyes:     Extraocular Movements: Extraocular movements intact.     Conjunctiva/sclera: Conjunctivae normal.  Cardiovascular:     Rate and Rhythm: Normal rate and regular rhythm.  Pulmonary:     Effort: Pulmonary effort is normal.     Breath sounds: Normal breath sounds.  Abdominal:     General: Bowel sounds are normal.     Palpations: Abdomen is soft.  Musculoskeletal:        General: Normal range of motion.     Cervical back: Normal range of motion and  neck supple.  Skin:    General: Skin is warm.  Neurological:     General: No focal deficit present.     Mental Status: He is alert and oriented to person, place, and time.  Psychiatric:        Mood and Affect: Mood normal.        Behavior: Behavior normal.      Assessment: *Cirrhosis-etiology likely alcohol, well compensated *Chronic alcohol use  Plan: Discussed cirrhosis in depth with patient today.  Etiology likely alcohol induced though we will need to perform full serological  work-up to rule out other causes, which we will do today.  Will also schedule for EGD for variceal screening. The risks including infection, bleed, or perforation as well as benefits, limitations, alternatives and imponderables have been reviewed with the patient. Potential for esophageal dilation, biopsy, etc. have also been reviewed.  Questions have been answered. All parties agreeable.  Ultrasound 08/29/2020 without hepatic lesions.  We will repeat 02/2021 along with AFP.  Counseled patient on the vast importance of alcohol cessation going forward.  Also counseled on the importance of tobacco cessation.  Patient to follow-up after EGD.  Thank you Dr. Gerarda Fraction for the kind referral.   10/10/2020 3:13 PM   Disclaimer: This note was dictated with voice recognition software. Similar sounding words can inadvertently be transcribed and may not be corrected upon review.

## 2020-10-10 NOTE — Telephone Encounter (Signed)
PA approved via [facility] for EGD. Auth# [id number] DOS Oct 29, 2020 - Jan 27, 2021

## 2020-10-14 ENCOUNTER — Telehealth: Payer: Self-pay | Admitting: *Deleted

## 2020-10-14 DIAGNOSIS — G894 Chronic pain syndrome: Secondary | ICD-10-CM | POA: Diagnosis not present

## 2020-10-14 NOTE — Telephone Encounter (Signed)
Called pt. Left detailed message regarding pre-op/covid test appt.

## 2020-10-16 LAB — COMPREHENSIVE METABOLIC PANEL
AG Ratio: 1.2 (calc) (ref 1.0–2.5)
ALT: 11 U/L (ref 9–46)
AST: 18 U/L (ref 10–35)
Albumin: 4.1 g/dL (ref 3.6–5.1)
Alkaline phosphatase (APISO): 89 U/L (ref 35–144)
BUN: 7 mg/dL (ref 7–25)
CO2: 27 mmol/L (ref 20–32)
Calcium: 9.3 mg/dL (ref 8.6–10.3)
Chloride: 102 mmol/L (ref 98–110)
Creat: 1.01 mg/dL (ref 0.70–1.25)
Globulin: 3.5 g/dL (calc) (ref 1.9–3.7)
Glucose, Bld: 69 mg/dL (ref 65–99)
Potassium: 3.9 mmol/L (ref 3.5–5.3)
Sodium: 140 mmol/L (ref 135–146)
Total Bilirubin: 0.6 mg/dL (ref 0.2–1.2)
Total Protein: 7.6 g/dL (ref 6.1–8.1)

## 2020-10-16 LAB — IRON,TIBC AND FERRITIN PANEL
%SAT: 35 % (calc) (ref 20–48)
Ferritin: 11 ng/mL — ABNORMAL LOW (ref 24–380)
Iron: 155 ug/dL (ref 50–180)
TIBC: 443 mcg/dL (calc) — ABNORMAL HIGH (ref 250–425)

## 2020-10-16 LAB — ANTI-NUCLEAR AB-TITER (ANA TITER)
ANA TITER: 1:40 {titer} — ABNORMAL HIGH
ANA Titer 1: 1:40 {titer} — ABNORMAL HIGH

## 2020-10-16 LAB — HEPATITIS C ANTIBODY
Hepatitis C Ab: NONREACTIVE
SIGNAL TO CUT-OFF: 0.01 (ref <1.00)

## 2020-10-16 LAB — HEPATITIS B SURFACE ANTIGEN: Hepatitis B Surface Ag: NONREACTIVE

## 2020-10-16 LAB — HEPATITIS A ANTIBODY, TOTAL: Hepatitis A AB,Total: NONREACTIVE

## 2020-10-16 LAB — ANA: Anti Nuclear Antibody (ANA): POSITIVE — AB

## 2020-10-16 LAB — PROTIME-INR
INR: 1.1
Prothrombin Time: 11 s (ref 9.0–11.5)

## 2020-10-16 LAB — HEPATITIS B SURFACE ANTIBODY,QUALITATIVE: Hep B S Ab: BORDERLINE — AB

## 2020-10-16 LAB — HEPATITIS B CORE ANTIBODY, TOTAL: Hep B Core Total Ab: NONREACTIVE

## 2020-10-16 LAB — MITOCHONDRIAL ANTIBODIES: Mitochondrial M2 Ab, IgG: <=20 U

## 2020-10-16 LAB — ANTI-SMOOTH MUSCLE ANTIBODY, IGG: Actin (Smooth Muscle) Antibody (IGG): <20 U (ref <20)

## 2020-10-23 NOTE — Patient Instructions (Signed)
Victor Fields  10/23/2020     @PREFPERIOPPHARMACY @   Your procedure is scheduled on  10/29/2020   Report to [facility] at  1215  P.M.   Call this number if you have problems the morning of surgery:  [phone]   Remember:  Follow the diet instructions given to you by the office.                     Take these medicines the morning of surgery with A SIP OF WATER  Amlodipine, tegretol, mobic, metoprolol, oxycodone (if needed), flomax.     Please brush your teeth.  Do not wear jewelry, make-up or nail polish.  Do not wear lotions, powders, or perfumes, or deodorant.  Do not shave 48 hours prior to surgery.  Men may shave face and neck.  Do not bring valuables to the hospital.  [facility] is not responsible for any belongings or valuables.  Contacts, dentures or bridgework may not be worn into surgery.  Leave your suitcase in the car.  After surgery it may be brought to your room.  For patients admitted to the hospital, discharge time will be determined by your treatment team.  Patients discharged the day of surgery will not be allowed to drive home and must have someone with them for 24 hours.   Special instructions:  DO NOT smoke tobacco or vape for 24 hours before your procedure.  Please read over the following fact sheets that you were given. Anesthesia Post-op Instructions and Care and Recovery After Surgery       Upper Endoscopy, Adult, Care After This sheet gives you information about how to care for yourself after your procedure. Your health care provider may also give you more specific instructions. If you have problems or questions, contact your health care provider. What can I expect after the procedure? After the procedure, it is common to have:  A sore throat.  Mild stomach pain or discomfort.  Bloating.  Nausea. Follow these instructions at home:  Follow instructions from your health care provider about what to eat or drink after your  procedure.  Return to your normal activities as told by your health care provider. Ask your health care provider what activities are safe for you.  Take over-the-counter and prescription medicines only as told by your health care provider.  If you were given a sedative during the procedure, it can affect you for several hours. Do not drive or operate machinery until your health care provider says that it is safe.  Keep all follow-up visits as told by your health care provider. This is important.   Contact a health care provider if you have:  A sore throat that lasts longer than one day.  Trouble swallowing. Get help right away if:  You vomit blood or your vomit looks like coffee grounds.  You have: ? A fever. ? Bloody, black, or tarry stools. ? A severe sore throat or you cannot swallow. ? Difficulty breathing. ? Severe pain in your chest or abdomen. Summary  After the procedure, it is common to have a sore throat, mild stomach discomfort, bloating, and nausea.  If you were given a sedative during the procedure, it can affect you for several hours. Do not drive or operate machinery until your health care provider says that it is safe.  Follow instructions from your health care provider about what to eat or drink after your procedure.  Return  to your normal activities as told by your health care provider. This information is not intended to replace advice given to you by your health care provider. Make sure you discuss any questions you have with your health care provider. Document Revised: 06/20/2019 Document Reviewed: 11/22/2017 Elsevier Patient Education  2021 [place] After This sheet gives you information about how to care for yourself after your procedure. Your health care provider may also give you more specific instructions. If you have problems or questions, contact your health care provider. What can I expect after the  procedure? After the procedure, it is common to have:  Tiredness.  Forgetfulness about what happened after the procedure.  Impaired judgment for important decisions.  Nausea or vomiting.  Some difficulty with balance. Follow these instructions at home: For the time period you were told by your health care provider:  Rest as needed.  Do not participate in activities where you could fall or become injured.  Do not drive or use machinery.  Do not drink alcohol.  Do not take sleeping pills or medicines that cause drowsiness.  Do not make important decisions or sign legal documents.  Do not take care of children on your own.      Eating and drinking  Follow the diet that is recommended by your health care provider.  Drink enough fluid to keep your urine pale yellow.  If you vomit: ? Drink water, juice, or soup when you can drink without vomiting. ? Make sure you have little or no nausea before eating solid foods. General instructions  Have a responsible adult stay with you for the time you are told. It is important to have someone help care for you until you are awake and alert.  Take over-the-counter and prescription medicines only as told by your health care provider.  If you have sleep apnea, surgery and certain medicines can increase your risk for breathing problems. Follow instructions from your health care provider about wearing your sleep device: ? Anytime you are sleeping, including during daytime naps. ? While taking prescription pain medicines, sleeping medicines, or medicines that make you drowsy.  Avoid smoking.  Keep all follow-up visits as told by your health care provider. This is important. Contact a health care provider if:  You keep feeling nauseous or you keep vomiting.  You feel light-headed.  You are still sleepy or having trouble with balance after 24 hours.  You develop a rash.  You have a fever.  You have redness or swelling around  the IV site. Get help right away if:  You have trouble breathing.  You have new-onset confusion at home. Summary  For several hours after your procedure, you may feel tired. You may also be forgetful and have poor judgment.  Have a responsible adult stay with you for the time you are told. It is important to have someone help care for you until you are awake and alert.  Rest as told. Do not drive or operate machinery. Do not drink alcohol or take sleeping pills.  Get help right away if you have trouble breathing, or if you suddenly become confused. This information is not intended to replace advice given to you by your health care provider. Make sure you discuss any questions you have with your health care provider. Document Revised: 03/07/2020 Document Reviewed: 05/25/2019 Elsevier Patient Education  2021 [employer].

## 2020-10-25 ENCOUNTER — Encounter (HOSPITAL_COMMUNITY): Payer: Self-pay

## 2020-10-25 ENCOUNTER — Other Ambulatory Visit: Payer: Self-pay

## 2020-10-25 ENCOUNTER — Encounter (HOSPITAL_COMMUNITY)
Admission: RE | Admit: 2020-10-25 | Discharge: 2020-10-25 | Disposition: A | Discharge status: Home / Self Care | Payer: Medicare Other | Source: Ambulatory Visit | Attending: Internal Medicine | Admitting: Internal Medicine

## 2020-10-25 ENCOUNTER — Other Ambulatory Visit (HOSPITAL_COMMUNITY)
Admission: RE | Admit: 2020-10-25 | Discharge: 2020-10-25 | Disposition: A | Discharge status: Home / Self Care | Payer: Medicare Other | Source: Ambulatory Visit | Attending: Internal Medicine | Admitting: Internal Medicine

## 2020-10-25 DIAGNOSIS — Z01818 Encounter for other preprocedural examination: Secondary | ICD-10-CM | POA: Diagnosis not present

## 2020-10-25 DIAGNOSIS — Z20822 Contact with and (suspected) exposure to covid-19: Secondary | ICD-10-CM | POA: Diagnosis not present

## 2020-10-25 LAB — CBC WITH DIFFERENTIAL/PLATELET
Abs Immature Granulocytes: 0.01 10*3/uL (ref 0.00–0.07)
Basophils Absolute: 0 10*3/uL (ref 0.0–0.1)
Basophils Relative: 0 %
Eosinophils Absolute: 0.4 10*3/uL (ref 0.0–0.5)
Eosinophils Relative: 5 %
HCT: 44.5 % (ref 39.0–52.0)
Hemoglobin: 14.5 g/dL (ref 13.0–17.0)
Immature Granulocytes: 0 %
Lymphocytes Relative: 27 %
Lymphs Abs: 2.1 10*3/uL (ref 0.7–4.0)
MCH: 30 pg (ref 26.0–34.0)
MCHC: 32.6 g/dL (ref 30.0–36.0)
MCV: 92.1 fL (ref 80.0–100.0)
Monocytes Absolute: 0.6 10*3/uL (ref 0.1–1.0)
Monocytes Relative: 7 %
Neutro Abs: 4.8 10*3/uL (ref 1.7–7.7)
Neutrophils Relative %: 61 %
Platelets: 145 10*3/uL — ABNORMAL LOW (ref 150–400)
RBC: 4.83 MIL/uL (ref 4.22–5.81)
RDW: 13.9 % (ref 11.5–15.5)
WBC: 7.9 10*3/uL (ref 4.0–10.5)
nRBC: 0 % (ref 0.0–0.2)

## 2020-10-25 LAB — SARS CORONAVIRUS 2 (TAT 6-24 HRS): SARS Coronavirus 2: NEGATIVE

## 2020-10-29 ENCOUNTER — Encounter (HOSPITAL_COMMUNITY)
Admission: RE | Disposition: A | Discharge status: Home / Self Care | Payer: Self-pay | Source: Home / Self Care | Attending: Internal Medicine

## 2020-10-29 ENCOUNTER — Encounter (HOSPITAL_COMMUNITY): Payer: Self-pay

## 2020-10-29 ENCOUNTER — Ambulatory Visit (HOSPITAL_COMMUNITY): Payer: Medicare Other | Admitting: Anesthesiology

## 2020-10-29 ENCOUNTER — Ambulatory Visit (HOSPITAL_COMMUNITY)
Admission: RE | Admit: 2020-10-29 | Discharge: 2020-10-29 | Disposition: A | Discharge status: Home / Self Care | Payer: Medicare Other | Attending: Internal Medicine | Admitting: Internal Medicine

## 2020-10-29 DIAGNOSIS — Z79899 Other long term (current) drug therapy: Secondary | ICD-10-CM | POA: Insufficient documentation

## 2020-10-29 DIAGNOSIS — Z888 Allergy status to other drugs, medicaments and biological substances status: Secondary | ICD-10-CM | POA: Insufficient documentation

## 2020-10-29 DIAGNOSIS — K7460 Unspecified cirrhosis of liver: Secondary | ICD-10-CM | POA: Insufficient documentation

## 2020-10-29 DIAGNOSIS — R7989 Other specified abnormal findings of blood chemistry: Secondary | ICD-10-CM | POA: Diagnosis not present

## 2020-10-29 DIAGNOSIS — K2970 Gastritis, unspecified, without bleeding: Secondary | ICD-10-CM | POA: Diagnosis not present

## 2020-10-29 DIAGNOSIS — Z7951 Long term (current) use of inhaled steroids: Secondary | ICD-10-CM | POA: Diagnosis not present

## 2020-10-29 DIAGNOSIS — B3781 Candidal esophagitis: Secondary | ICD-10-CM | POA: Insufficient documentation

## 2020-10-29 DIAGNOSIS — F17210 Nicotine dependence, cigarettes, uncomplicated: Secondary | ICD-10-CM | POA: Insufficient documentation

## 2020-10-29 DIAGNOSIS — Z791 Long term (current) use of non-steroidal anti-inflammatories (NSAID): Secondary | ICD-10-CM | POA: Insufficient documentation

## 2020-10-29 DIAGNOSIS — Z79891 Long term (current) use of opiate analgesic: Secondary | ICD-10-CM | POA: Insufficient documentation

## 2020-10-29 HISTORY — PX: ESOPHAGEAL BRUSHING: SHX6842

## 2020-10-29 HISTORY — PX: ESOPHAGOGASTRODUODENOSCOPY (EGD) WITH PROPOFOL: SHX5813

## 2020-10-29 LAB — KOH PREP

## 2020-10-29 SURGERY — ESOPHAGOGASTRODUODENOSCOPY (EGD) WITH PROPOFOL
Anesthesia: General

## 2020-10-29 MED ORDER — LACTATED RINGERS IV SOLN
INTRAVENOUS | Status: DC
  Administered 2020-10-29: 1000 mL via INTRAVENOUS

## 2020-10-29 MED ORDER — EPHEDRINE 5 MG/ML INJ
INTRAVENOUS | Status: AC
  Filled 2020-10-29: qty 10

## 2020-10-29 MED ORDER — LIDOCAINE VISCOUS HCL 2 % MT SOLN
OROMUCOSAL | Status: AC
  Filled 2020-10-29: qty 15

## 2020-10-29 MED ORDER — LIDOCAINE VISCOUS HCL 2 % MT SOLN
15.0000 mL | Freq: Once | OROMUCOSAL | Status: AC
  Administered 2020-10-29: 15 mL via OROMUCOSAL

## 2020-10-29 MED ORDER — STERILE WATER FOR IRRIGATION IR SOLN
Status: DC | PRN
  Administered 2020-10-29: 100 mL

## 2020-10-29 MED ORDER — PROPOFOL 10 MG/ML IV BOLUS
INTRAVENOUS | Status: DC | PRN
  Administered 2020-10-29: 150 mg via INTRAVENOUS

## 2020-10-29 MED ORDER — EPHEDRINE SULFATE-NACL 50-0.9 MG/10ML-% IV SOSY
PREFILLED_SYRINGE | INTRAVENOUS | Status: DC | PRN
Start: 2020-10-29 — End: 2020-10-29
  Administered 2020-10-29: 10 mg via INTRAVENOUS

## 2020-10-29 MED ORDER — LIDOCAINE HCL (CARDIAC) PF 100 MG/5ML IV SOSY
PREFILLED_SYRINGE | INTRAVENOUS | Status: DC | PRN
  Administered 2020-10-29: 50 mg via INTRAVENOUS

## 2020-10-29 NOTE — Anesthesia Postprocedure Evaluation (Signed)
Anesthesia Post Note  Patient: Victor Fields  Procedure(s) Performed: ESOPHAGOGASTRODUODENOSCOPY (EGD) WITH PROPOFOL (N/A ) ESOPHAGEAL BRUSHING  Patient location during evaluation: Phase II Anesthesia Type: General Level of consciousness: awake and alert and oriented Pain management: pain level controlled Vital Signs Assessment: post-procedure vital signs reviewed and stable Respiratory status: spontaneous breathing and respiratory function stable Cardiovascular status: blood pressure returned to baseline and stable Postop Assessment: no apparent nausea or vomiting Anesthetic complications: no   No complications documented.   Last Vitals:  Vitals:   10/29/20 1315 10/29/20 1502  BP: 119/86 103/71  Pulse: 70 86  Resp: 19 17  Temp: 36.7 C 36.7 C  SpO2: 96% 93%    Last Pain:  Vitals:   10/29/20 1502  TempSrc: Oral  PainSc: 0-No pain                 Lux Skilton C Brizeida Mcmurry

## 2020-10-29 NOTE — Op Note (Signed)
[facility] Patient Name: Victor Fields Procedure Date: 10/29/2020 2:41 PM MRN: [id number] Date of Birth: 1955/09/16 Attending MD: Elon Alas. Abbey Chatters DO CSN: [id number] Age: 65 Admit Type: Outpatient Procedure:                Upper GI endoscopy Indications:              Cirrhosis rule out esophageal varices Providers:                Elon Alas. Abbey Chatters, DO, Lambert Mody, Dereck Leep, Technician Referring MD:              Medicines:                See the Anesthesia note for documentation of the                            administered medications Complications:            No immediate complications. Estimated Blood Loss:     Estimated blood loss was minimal. Procedure:                Pre-Anesthesia Assessment:                           - The anesthesia plan was to use monitored                            anesthesia care (MAC).                           After obtaining informed consent, the endoscope was                            passed under direct vision. Throughout the                            procedure, the patient's blood pressure, pulse, and                            oxygen saturations were monitored continuously. The                            [id number]-H190 ([id number]) scope was introduced through the                            mouth, and advanced to the second part of duodenum.                            The upper GI endoscopy was accomplished without                            difficulty. The patient tolerated the procedure                            well. Scope  In: 2:50:15 PM Scope Out: 2:52:41 PM Total Procedure Duration: 0 hours 2 minutes 26 seconds  Findings:      Moderately severe esophagitis with no bleeding was found in the middle       third of the esophagus. Cells for cytology were obtained by brushing.      There is no endoscopic evidence of varices in the entire esophagus.      Localized mild inflammation characterized  by erythema was found in the       gastric antrum.      The duodenal bulb, first portion of the duodenum and second portion of       the duodenum were normal. Impression:               - Moderately severe candidiasis esophagitis with no                            bleeding. Cells for cytology obtained.                           - Gastritis.                           - Normal duodenal bulb, first portion of the                            duodenum and second portion of the duodenum. Moderate Sedation:      Per Anesthesia Care Recommendation:           - Patient has a contact number available for                            emergencies. The signs and symptoms of potential                            delayed complications were discussed with the                            patient. Return to normal activities tomorrow.                            Written discharge instructions were provided to the                            patient.                           - Resume previous diet.                           - Continue present medications.                           - Await pathology results.                           - Return to GI clinic in 3 months.                           -  Treat for candidal esophagitis if positive.                           - Repeat EGD in 2 years or sooner if decompensating                            event. Procedure Code(s):        --- Professional ---                           [phone], Esophagogastroduodenoscopy, flexible,                            transoral; diagnostic, including collection of                            specimen(s) by brushing or washing, when performed                            (separate procedure) Diagnosis Code(s):        --- Professional ---                           B37.81, Candidal esophagitis                           K29.70, Gastritis, unspecified, without bleeding                           K74.60, Unspecified cirrhosis of liver CPT  copyright 2019 American Medical Association. All rights reserved. The codes documented in this report are preliminary and upon coder review may  be revised to meet current compliance requirements. Elon Alas. Abbey Chatters, DO [place] Abbey Chatters, DO 10/29/2020 3:05:55 PM This report has been signed electronically. Number of Addenda: 0

## 2020-10-29 NOTE — Anesthesia Preprocedure Evaluation (Addendum)
Anesthesia Evaluation  Patient identified by MRN, date of birth, ID band Patient awake    Reviewed: Allergy & Precautions, NPO status , Patient's Chart, lab work & pertinent test results, reviewed documented beta blocker date and time   History of Anesthesia Complications Negative for: history of anesthetic complications  Airway Mallampati: II  TM Distance: >3 FB Neck ROM: Full    Dental  (+) Edentulous Upper, Edentulous Lower   Pulmonary Current SmokerPatient did not abstain from smoking.,    Pulmonary exam normal breath sounds clear to auscultation       Cardiovascular Exercise Tolerance: Good hypertension, Pt. on home beta blockers and Pt. on medications Normal cardiovascular exam Rhythm:Regular Rate:Normal     Neuro/Psych PSYCHIATRIC DISORDERS Depression Schizophrenia    GI/Hepatic (+) Cirrhosis     substance abuse  alcohol use and marijuana use,   Endo/Other  diabetes (not on meds), Well Controlled, Type 2Hyperthyroidism   Renal/GU      Musculoskeletal  (+) Arthritis , narcotic dependent  Abdominal   Peds  Hematology   Anesthesia Other Findings   Reproductive/Obstetrics                            Anesthesia Physical Anesthesia Plan  ASA: III  Anesthesia Plan: General   Post-op Pain Management:    Induction: Intravenous  PONV Risk Score and Plan: Propofol infusion  Airway Management Planned: Nasal Cannula and Natural Airway  Additional Equipment:   Intra-op Plan:   Post-operative Plan:   Informed Consent: I have reviewed the patients History and Physical, chart, labs and discussed the procedure including the risks, benefits and alternatives for the proposed anesthesia with the patient or authorized representative who has indicated his/her understanding and acceptance.       Plan Discussed with: CRNA and Surgeon  Anesthesia Plan Comments:        Anesthesia  Quick Evaluation

## 2020-10-29 NOTE — Discharge Instructions (Addendum)
EGD Discharge instructions Please read the instructions outlined below and refer to this sheet in the next few weeks. These discharge instructions provide you with general information on caring for yourself after you leave the hospital. Your doctor may also give you specific instructions. While your treatment has been planned according to the most current medical practices available, unavoidable complications occasionally occur. If you have any problems or questions after discharge, please call your doctor. ACTIVITY  You may resume your regular activity but move at a slower pace for the next 24 hours.   Take frequent rest periods for the next 24 hours.   Walking will help expel (get rid of) the air and reduce the bloated feeling in your abdomen.   No driving for 24 hours (because of the anesthesia (medicine) used during the test).   You may shower.   Do not sign any important legal documents or operate any machinery for 24 hours (because of the anesthesia used during the test).  NUTRITION  Drink plenty of fluids.   You may resume your normal diet.   Begin with a light meal and progress to your normal diet.   Avoid alcoholic beverages for 24 hours or as instructed by your caregiver.  MEDICATIONS  You may resume your normal medications unless your caregiver tells you otherwise.  WHAT YOU CAN EXPECT TODAY  You may experience abdominal discomfort such as a feeling of fullness or "gas" pains.  FOLLOW-UP  Your doctor will discuss the results of your test with you.  SEEK IMMEDIATE MEDICAL ATTENTION IF ANY OF THE FOLLOWING OCCUR:  Excessive nausea (feeling sick to your stomach) and/or vomiting.   Severe abdominal pain and distention (swelling).   Trouble swallowing.   Temperature over 101 F (37.8 C).   Rectal bleeding or vomiting of blood.   Your EGD showed what appeared to be a yeast infection in your esophagus.  I took samples of this and if it is positive for yeast, we will  treat you with Diflucan x14 days.  I did not see any esophageal varices (blood vessels) in your esophagus related to your cirrhosis.  We will plan a repeat EGD in 2 years.  Follow-up with GI in 2 to 3 months or sooner if needed.   I hope you have a great rest of your week!  Victor Fields. Abbey Chatters, D.O. Gastroenterology and Hepatology [facility] Gastroenterology AssociatesPATIENT INSTRUCTIONS POST-ANESTHESIA  IMMEDIATELY FOLLOWING SURGERY:  Do not drive or operate machinery for the first twenty four hours after surgery.  Do not make any important decisions for twenty four hours after surgery or while taking narcotic pain medications or sedatives.  If you develop intractable nausea and vomiting or a severe headache please notify your doctor immediately.  FOLLOW-UP:  Please make an appointment with your surgeon as instructed. You do not need to follow up with anesthesia unless specifically instructed to do so.  WOUND CARE INSTRUCTIONS (if applicable):  Keep a dry clean dressing on the anesthesia/puncture wound site if there is drainage.  Once the wound has quit draining you may leave it open to air.  Generally you should leave the bandage intact for twenty four hours unless there is drainage.  If the epidural site drains for more than 36-48 hours please call the anesthesia department.  QUESTIONS?:  Please feel free to call your physician or the hospital operator if you have any questions, and they will be happy to assist you.

## 2020-10-29 NOTE — Interval H&P Note (Signed)
History and Physical Interval Note:  10/29/2020 1:54 PM  Victor Fields  has presented today for surgery, with the diagnosis of cirrhosis.  The various methods of treatment have been discussed with the patient and family. After consideration of risks, benefits and other options for treatment, the patient has consented to  Procedure(s) with comments: ESOPHAGOGASTRODUODENOSCOPY (EGD) WITH PROPOFOL (N/A) - pm appt as a surgical intervention.  The patient's history has been reviewed, patient examined, no change in status, stable for surgery.  I have reviewed the patient's chart and labs.  Questions were answered to the patient's satisfaction.     [name]

## 2020-10-29 NOTE — Anesthesia Procedure Notes (Signed)
Date/Time: 10/29/2020 2:51 PM Performed by: Orlie Dakin, CRNA Pre-anesthesia Checklist: Patient identified, Emergency Drugs available, Suction available and Patient being monitored Patient Re-evaluated:Patient Re-evaluated prior to induction Oxygen Delivery Method: Nasal cannula Induction Type: IV induction Placement Confirmation: positive ETCO2

## 2020-10-29 NOTE — Transfer of Care (Signed)
Immediate Anesthesia Transfer of Care Note  Patient: Victor Fields  Procedure(s) Performed: ESOPHAGOGASTRODUODENOSCOPY (EGD) WITH PROPOFOL (N/A ) ESOPHAGEAL BRUSHING  Patient Location: Short Stay  Anesthesia Type:General  Level of Consciousness: awake, alert  and oriented  Airway & Oxygen Therapy: Patient Spontanous Breathing  Post-op Assessment: Report given to RN and Post -op Vital signs reviewed and stable  Post vital signs: Reviewed and stable  Last Vitals:  Vitals Value Taken Time  BP    Temp    Pulse    Resp    SpO2      Last Pain:  Vitals:   10/29/20 1448  TempSrc:   PainSc: 5       Patients Stated Pain Goal: 6 (83/09/40 7680)  Complications: No complications documented.

## 2020-10-31 ENCOUNTER — Encounter (HOSPITAL_COMMUNITY): Payer: Self-pay | Admitting: Internal Medicine

## 2020-11-06 ENCOUNTER — Telehealth: Payer: Self-pay | Admitting: Internal Medicine

## 2020-11-06 MED ORDER — FLUCONAZOLE 200 MG PO TABS: ORAL_TABLET | ORAL | 0 refills | Status: DC

## 2020-11-06 NOTE — Telephone Encounter (Signed)
I have attempted to call patient in regards to his labs as well as EGD results.  Does appear that he has candidal esophagitis.  I am going to send him in prescription for Diflucan to pharmacy.  He is to take 2 tablets on day 1 and then 1 tablet daily for a total of 14 days.  His liver labs looked good.  Meld score is low at 8 which is a good thing.  Follow-up as previously scheduled.

## 2020-11-07 NOTE — Telephone Encounter (Signed)
Phoned and LMOVM for the pt to return call 

## 2020-11-11 NOTE — Telephone Encounter (Signed)
Phoned and LMOVM for the pt return call

## 2020-11-11 NOTE — Telephone Encounter (Signed)
Returned the pt's call and was advised he did pick up his Rx and he began taking it today. Advised of results and the pt is to follow-up as previously scheduled. Pt agreed to these instructions.

## 2020-11-13 DIAGNOSIS — G894 Chronic pain syndrome: Secondary | ICD-10-CM | POA: Diagnosis not present

## 2020-11-13 DIAGNOSIS — M1991 Primary osteoarthritis, unspecified site: Secondary | ICD-10-CM | POA: Diagnosis not present

## 2020-12-12 DIAGNOSIS — J449 Chronic obstructive pulmonary disease, unspecified: Secondary | ICD-10-CM | POA: Diagnosis not present

## 2020-12-12 DIAGNOSIS — G894 Chronic pain syndrome: Secondary | ICD-10-CM | POA: Diagnosis not present

## 2021-01-02 DIAGNOSIS — I10 Essential (primary) hypertension: Secondary | ICD-10-CM | POA: Diagnosis not present

## 2021-01-09 DIAGNOSIS — G894 Chronic pain syndrome: Secondary | ICD-10-CM | POA: Diagnosis not present

## 2021-01-09 DIAGNOSIS — M159 Polyosteoarthritis, unspecified: Secondary | ICD-10-CM | POA: Diagnosis not present

## 2021-01-20 NOTE — Progress Notes (Signed)
Referring Provider: Redmond School, MD Primary Care Physician:  Redmond School, MD Primary GI Physician: Dr. Abbey Chatters  Chief Complaint  Patient presents with   Cirrhosis    Pp f/u, doing ok    HPI:   Victor Fields is a 65 y.o. male presenting today for follow-up of cirrhosis and candidal esophagitis.   History of cirrhosis (diagnosed July 2021) with history of chronic heavy alcohol use s/p serologic evaluation in April 2022 with positive ANA 1:40 titer with cytoplasmic and nuclear speckled pattern. ASMA, AMA negative. Hep C negative. Hep B with borderline immunity, no evidence of acute or prior infection. Hep A Ab total negative. Iron panel with ferritin 11 (L). Hg within normal limits. Platelets 145 (L). MELD 8 at that time. Due for [place] with AFP in August 2022.   EGD 10/29/20 with moderately severe candidiasis esophagitis with no bleeding, gastritis, normal examined duodenum. Repeat in 2 years. Prescribed diflucan x14 days  Today:   Cirrhosis: Quit drinking alcohol after seeing Dr. Abbey Chatters in April.  Denies swelling in his abdomen or lower extremities, confusion or changes in mental status, yellowing of the eyes or skin, BRBPR, or melena.  Esophageal candidiasis: Took diflucan.  Denies dysphagia, odynophagia.  Denies abdominal pain, nausea, vomiting, reflux symptoms, constipation, or diarrhea.   Per chart review, patient has lost 11 pounds in the last 3 months and 18 pounds over the last year.  Reports over the last 6 to 8 months, he has not had a great appetite.  Feels he gets full quickly.  Used to be a diabetic, but his hemoglobin A1c has been in the 6 range and he has diabetes medications have been discontinued.  He does have anxiety and depression is not sure if this is contributing.  Also states he does not do very much.  He sits on the couch and watches TV which, "does not work-up much of an appetite".    Colon cancer screening: Reported having Cologuard in March 2022 that  was negative. Has had a colonoscopy in the past.  Cannot tell me exactly when, but reports it was [facility].  No polyps. Not having another one, ever. Had a bad experience last time.  States it was very painful and he was awake during the exam.    Low iron: No brbpr, melena, hematuria, or nose bleeds.   Past Medical History:  Diagnosis Date   Back pain    Cirrhosis ([place]) 01/2020   No immunity to hepatitis A or B   Elevated cholesterol    Enlarged prostate    Hypertension     Past Surgical History:  Procedure Laterality Date   crush injuries Left    left hip and knee   ESOPHAGEAL BRUSHING  10/29/2020   Procedure: ESOPHAGEAL BRUSHING;  Surgeon: [name], DO;  Location: [facility];  Service: Endoscopy;;   ESOPHAGOGASTRODUODENOSCOPY (EGD) WITH PROPOFOL N/A 10/29/2020   Surgeon: [name], DO;moderately severe candidiasis esophagitis with no bleeding, gastritis, normal examined duodenum. Repeat in 2 years.   LUNG SURGERY Left    TONSILLECTOMY      Current Outpatient Medications  Medication Sig Dispense Refill   amLODipine (NORVASC) 10 MG tablet Take 10 mg by mouth daily.     Budeson-Glycopyrrol-Formoterol (BREZTRI AEROSPHERE) 160-9-4.8 MCG/ACT AERO Inhale 2 puffs into the lungs 2 (two) times daily.     carbamazepine (TEGRETOL) 100 MG chewable tablet Chew 1 tablet (100 mg total) by mouth 3 (three) times daily. (Patient taking  differently: Chew 100 mg by mouth 2 (two) times daily.) 90 tablet 0   doxylamine, Sleep, (UNISOM) 25 MG tablet Take 25 mg by mouth at bedtime as needed for sleep.     lisinopril (ZESTRIL) 20 MG tablet Take 1 tablet (20 mg total) by mouth daily. 30 tablet 0   metoprolol tartrate (LOPRESSOR) 25 MG tablet Take 1 tablet (25 mg total) by mouth 2 (two) times daily. 60 tablet 0   oxyCODONE (ROXICODONE) 15 MG immediate release tablet Take 1 tablet by mouth every 4 (four) hours as needed for pain.     simvastatin (ZOCOR) 40 MG tablet Take 1 tablet (40 mg  total) by mouth daily. 30 tablet 0   tamsulosin (FLOMAX) 0.4 MG CAPS capsule Take 1 capsule (0.4 mg total) by mouth daily. 30 capsule 0   No current facility-administered medications for this visit.    Allergies as of 01/22/2021 - Review Complete 01/22/2021  Allergen Reaction Noted   Neurontin [gabapentin] Other (See Comments) 08/24/2014    Family History  Problem Relation Age of Onset   Cancer Mother    CAD Father    Colon cancer Neg Hx    Liver disease Neg Hx     Social History   Socioeconomic History   Marital status: Divorced    Spouse name: Not on file   Number of children: Not on file   Years of education: Not on file   Highest education level: Not on file  Occupational History   Not on file  Tobacco Use   Smoking status: Every Day    Packs/day: 1.00    Years: 28.00    Pack years: 28.00    Types: Cigarettes   Smokeless tobacco: Never  Vaping Use   Vaping Use: Never used  Substance and Sexual Activity   Alcohol use: Not Currently    Comment: Quit April 2022   Drug use: Yes    Types: Marijuana   Sexual activity: Yes  Other Topics Concern   Not on file  Social History Narrative      Used to be a Geophysicist/field seismologist   Used to smoke until 2012   Used to drink heavily a long time ago   Prior drug use   Current wife is 2nd wife-married 2007   Finished high school-did some college   Social Determinants of Radio broadcast assistant Strain: Not on file  Food Insecurity: Not on file  Transportation Needs: Not on file  Physical Activity: Not on file  Stress: Not on file  Social Connections: Not on file    Review of Systems: Gen: Denies fever, chills, cold or flulike symptoms, presyncope, syncope. CV: Denies chest pain, palpitations Resp: Denies dyspnea or cough GI: See HPI Heme: See HPI   Physical Exam: BP 105/70   Pulse 73   Temp 97.7 F (36.5 C) (Temporal)   Ht 5\' 4"  (1.626 m)   Wt 166 lb 12.8 oz (75.7 kg)   BMI 28.63 kg/m  General:   Alert and  oriented. No distress noted. Pleasant and cooperative.  Head:  Normocephalic and atraumatic. Eyes:  Conjuctiva clear without scleral icterus. Heart:  S1, S2 present without murmurs appreciated. Lungs:  Clear to auscultation bilaterally. No wheezes, rales, or rhonchi. No distress.  Abdomen:  +BS, soft, non-tender and non-distended. No rebound or guarding. No HSM or masses noted. Msk:  Symmetrical without gross deformities. Normal posture. Extremities:  Without edema. Neurologic:  Alert and  oriented x4 Psych: Normal mood and  affect.    Assessment: 65 year old male with suspected alcoholic cirrhosis, diagnosed July 2021, and well compensated presenting today for follow-up s/p EGD.  Also discussed weight loss and low ferritin.  Cirrhosis: Likely secondary to history of chronic alcohol use, abstinent since April 2022.  Associated mild thrombocytopenia.  Remains well compensated.  MELD 8.  Significant serologic work-up with positive ANA 1: 40 titer with cytoplasmic and nuclear speckled pattern.  ASMA, ANA negative.  Screen for viral hepatitis negative, no immunity to hepatitis A or B.  Hemochromatosis screen negative.  Overall, suspect positive ANA with fairly low titer is likely a normal variant here or possibly secondary to chronic alcohol use.  Doubt autoimmune component as LFTs have been normal.  However, will obtain immunoglobulins for completeness.  Also needs first ever AFP.  EGD on file April 2022 with no esophageal varices.    Low ferritin: Iron panel April 2022 with ferritin 11, iron 155, saturation 35%.  Hemoglobin 14.5.  Denies BRBPR or melena.  Overall, non-specific. Will repeat labs today to ensure hemoglobin and iron panel are not downtrending.   Unintentional weight loss: 18 pound weight loss over the last 6 months, 11 pound weight loss over the last 3 months.  Patient reports early satiety/lack of appetite without any other significant GI symptoms.  EGD April 2022 with moderately  severe esophageal candidiasis, gastritis.  He completed a course of Diflucan.  Last colonoscopy many years ago, denies history of polyps.  Reports Cologuard in March 2022 that was negative.  Denies BRBPR or melena.  CT A/P on file from July 2021 with cirrhosis and prostatomegaly (chronic).  Etiology of weight loss is not clear.  There is some concern for underlying neoplasm.  Could also have component of gastroparesis with history of diabetes.  Discussed extensively with patient today and offered colonoscopy, CT abdomen and pelvis, and gastric emptying study to further evaluate.  Patient has requested to hold off on further evaluation for now and monitor for a few more months.    Plan: 1.  CBC, iron panel, AFP, immunoglobulins. 2.  Due for RUQ ultrasound at the end of August.  Requested nursing staff to arrange. 3.  Due for routine cirrhosis blood work October 2022.  Requested nursing staff to arrange. 4.  Advised to discuss hep A/B vaccination with Dr. Gerarda Fraction.  5.  Strict 2 g sodium diet. 6.  No more than 2000 mg of Tylenol per day. 7.  Eat 4-6 small meals daily. 8.  Limit fried/fatty/greasy foods. 9.  Add 1-2 protein shakes daily to help maintain weight. 10.  Follow-up in 3 to 4 months.  Call sooner if questions or concerns prior.    Aliene Altes, PA-C [facility] 01/22/2021

## 2021-01-22 ENCOUNTER — Encounter: Payer: Self-pay | Admitting: Gastroenterology

## 2021-01-22 ENCOUNTER — Ambulatory Visit: Payer: Medicare Other | Admitting: Gastroenterology

## 2021-01-22 ENCOUNTER — Other Ambulatory Visit: Payer: Self-pay

## 2021-01-22 ENCOUNTER — Encounter: Payer: Self-pay | Admitting: Internal Medicine

## 2021-01-22 VITALS — BP 105/70 | HR 73 | Temp 97.7°F | Ht 64.0 in | Wt 166.8 lb

## 2021-01-22 DIAGNOSIS — K7460 Unspecified cirrhosis of liver: Secondary | ICD-10-CM

## 2021-01-22 DIAGNOSIS — R790 Abnormal level of blood mineral: Secondary | ICD-10-CM | POA: Diagnosis not present

## 2021-01-22 DIAGNOSIS — R634 Abnormal weight loss: Secondary | ICD-10-CM

## 2021-01-22 NOTE — Patient Instructions (Signed)
Please have blood work completed at [facility] in the next week.  We have printed out your lab orders for you.  We will arrange for you to have an ultrasound at the end of August.  You will also be due for routine blood work in October 2022.  Our nursing staff will reach out to you closer to time.  Please discuss hepatitis A/B vaccination with Dr. Gerarda Fraction.   Follow a low-sodium diet.  No more than 2000 mg of sodium per day.  This includes all foods and liquids consumed.  Limit Tylenol.  No more than 2000 mg per 24-hour period.  I recommend you eat 4-6 small meals daily. Limit fried/fatty/greasy foods.  Add 1-2 protein shakes daily to help maintain your weight.  We will see you back in 3-4 months.  Do not hesitate to call if you have any questions or concerns prior.   It was a pleasure to meet you today!  Aliene Altes, PA-C [facility]

## 2021-01-24 DIAGNOSIS — K7460 Unspecified cirrhosis of liver: Secondary | ICD-10-CM | POA: Diagnosis not present

## 2021-01-24 DIAGNOSIS — R790 Abnormal level of blood mineral: Secondary | ICD-10-CM | POA: Diagnosis not present

## 2021-01-27 LAB — CBC WITH DIFFERENTIAL/PLATELET
Absolute Monocytes: 483 cells/uL (ref 200–950)
Basophils Absolute: 28 cells/uL (ref 0–200)
Basophils Relative: 0.4 %
Eosinophils Absolute: 462 cells/uL (ref 15–500)
Eosinophils Relative: 6.6 %
HCT: 42.8 % (ref 38.5–50.0)
Hemoglobin: 14.5 g/dL (ref 13.2–17.1)
Lymphs Abs: 2121 cells/uL (ref 850–3900)
MCH: 31 pg (ref 27.0–33.0)
MCHC: 33.9 g/dL (ref 32.0–36.0)
MCV: 91.5 fL (ref 80.0–100.0)
MPV: 11.2 fL (ref 7.5–12.5)
Monocytes Relative: 6.9 %
Neutro Abs: 3906 cells/uL (ref 1500–7800)
Neutrophils Relative %: 55.8 %
Platelets: 97 10*3/uL — ABNORMAL LOW (ref 140–400)
RBC: 4.68 10*6/uL (ref 4.20–5.80)
RDW: 13.7 % (ref 11.0–15.0)
Total Lymphocyte: 30.3 %
WBC: 7 10*3/uL (ref 3.8–10.8)

## 2021-01-27 LAB — IGG, IGA, IGM
IgG (Immunoglobin G), Serum: 1049 mg/dL (ref 600–1540)
IgM, Serum: 169 mg/dL (ref 50–300)
Immunoglobulin A: 464 mg/dL — ABNORMAL HIGH (ref 70–320)

## 2021-01-27 LAB — IRON,TIBC AND FERRITIN PANEL
%SAT: 22 % (calc) (ref 20–48)
Ferritin: 13 ng/mL — ABNORMAL LOW (ref 24–380)
Iron: 72 ug/dL (ref 50–180)
TIBC: 322 mcg/dL (calc) (ref 250–425)

## 2021-01-27 LAB — AFP TUMOR MARKER: AFP-Tumor Marker: 6.6 ng/mL — ABNORMAL HIGH (ref <6.1)

## 2021-02-03 ENCOUNTER — Encounter: Payer: Self-pay | Admitting: *Deleted

## 2021-02-10 ENCOUNTER — Telehealth (INDEPENDENT_AMBULATORY_CARE_PROVIDER_SITE_OTHER): Payer: Self-pay | Admitting: *Deleted

## 2021-02-10 DIAGNOSIS — M159 Polyosteoarthritis, unspecified: Secondary | ICD-10-CM | POA: Diagnosis not present

## 2021-02-10 DIAGNOSIS — G894 Chronic pain syndrome: Secondary | ICD-10-CM | POA: Diagnosis not present

## 2021-02-10 NOTE — Telephone Encounter (Signed)
Message left 8/5 at 11:27 AM  He got letter about results and is returning your call.  BX:[id number]

## 2021-02-10 NOTE — Telephone Encounter (Signed)
Fowarding to [place] to call with pt results. See lab result note

## 2021-02-12 NOTE — Telephone Encounter (Signed)
Spoke to pt and made him aware of results.  Informed him of recommendations:  1.  Informed him we need [place] moved up sooner due to slightly elevated AFP.  2.  Informed patient that if he is agreeable, we would like to arrange TCS with propofol with Dr. Abbey Chatters. (Dx: Iron deficiency, weight loss. ASA III).  Pt informed me that he would like to keep [place] as is for now.  Did not want to move up because he said he may have to cancel due to finances.  He also declined TCS for now.  Stated that he is having trouble paying all of his bills right now.  Informed him of [place] forms and will mail form to pt.

## 2021-02-12 NOTE — Telephone Encounter (Signed)
Lmom for pt to call me back. 

## 2021-02-12 NOTE — Telephone Encounter (Signed)
Noted  

## 2021-03-03 ENCOUNTER — Ambulatory Visit (HOSPITAL_COMMUNITY)
Admission: RE | Admit: 2021-03-03 | Discharge: 2021-03-03 | Disposition: A | Discharge status: Home / Self Care | Payer: Medicare Other | Source: Ambulatory Visit | Attending: Gastroenterology | Admitting: Gastroenterology

## 2021-03-03 ENCOUNTER — Other Ambulatory Visit: Payer: Self-pay

## 2021-03-03 DIAGNOSIS — K7460 Unspecified cirrhosis of liver: Secondary | ICD-10-CM | POA: Insufficient documentation

## 2021-03-13 ENCOUNTER — Telehealth: Payer: Self-pay | Admitting: Internal Medicine

## 2021-03-13 DIAGNOSIS — M1991 Primary osteoarthritis, unspecified site: Secondary | ICD-10-CM | POA: Diagnosis not present

## 2021-03-13 DIAGNOSIS — G894 Chronic pain syndrome: Secondary | ICD-10-CM | POA: Diagnosis not present

## 2021-03-13 NOTE — Telephone Encounter (Signed)
Pt said he was returning a call. I told him everyone was in a meeting and the nurse would call him back. He wanted to know which number would be calling because he is afraid to answer a call that he doesn't know who it is. I told him the only number I know is the number he called to reach [place]. Please call [phone]

## 2021-03-14 ENCOUNTER — Telehealth: Payer: Self-pay | Admitting: Internal Medicine

## 2021-03-14 NOTE — Telephone Encounter (Signed)
Patient said someone from here called him possible regarding test results. Please call back

## 2021-03-17 ENCOUNTER — Other Ambulatory Visit: Payer: Self-pay

## 2021-03-17 DIAGNOSIS — K7460 Unspecified cirrhosis of liver: Secondary | ICD-10-CM

## 2021-03-17 DIAGNOSIS — E0590 Thyrotoxicosis, unspecified without thyrotoxic crisis or storm: Secondary | ICD-10-CM

## 2021-03-17 DIAGNOSIS — E1165 Type 2 diabetes mellitus with hyperglycemia: Secondary | ICD-10-CM

## 2021-04-10 DIAGNOSIS — M1991 Primary osteoarthritis, unspecified site: Secondary | ICD-10-CM | POA: Diagnosis not present

## 2021-04-10 DIAGNOSIS — G894 Chronic pain syndrome: Secondary | ICD-10-CM | POA: Diagnosis not present

## 2021-05-12 DIAGNOSIS — M159 Polyosteoarthritis, unspecified: Secondary | ICD-10-CM | POA: Diagnosis not present

## 2021-05-12 DIAGNOSIS — G894 Chronic pain syndrome: Secondary | ICD-10-CM | POA: Diagnosis not present

## 2021-05-26 NOTE — Progress Notes (Deleted)
Referring Provider: Redmond School, MD Primary Care Physician:  Redmond School, MD Primary GI Physician: Dr. Abbey Chatters  No chief complaint on file.   HPI:   Victor Fields is a 65 y.o. male presenting today for follow-up of cirrhosis and weight loss.  History of suspected alcoholic cirrhosis, diagnosed July 2021, s/p extensive serologic evaluation with positive ANA, but ASMA, AMA negative, and immunoglobulins aside from mildly elevated IgA all within normal limits.  Overall, suspect positive ANA with fairly low titer is likely a normal variant here or possibly secondary to chronic alcohol use.  Also with history of Candida esophagitis noted on EGD in April 2022 treated with Diflucan.  Recommended repeat in 2 years for esophageal variceal screening.  Last seen in our office 01/22/2021.  Reported he quit drinking alcohol in April.  Had no signs or symptoms of decompensated liver disease.  He was doing well following course of Diflucan.  Denies any significant upper or lower GI symptoms.  Per chart review, noted patient has lost 11 pounds over the last 3 months, 18 pounds over the last year.  He reported decreased appetite x6-8 months and early satiety.  Noted history of anxiety/depression and spends a lot of time sitting on the couch watching TV.  Reported he had a colonoscopy in the past and would never have another 1.  Reported Cologuard in March that was negative.  Denies BRBPR or melena.  Noted his ferritin was low at 11 in April, iron saturation percent as well as hemoglobin within normal limits.  Fairly recent CT on file July 2021 with cirrhosis and prostatomegaly (chronic).  For weight loss, offered colonoscopy, CT A/P, and gastric emptying study, but patient requested to hold off for now and monitor.  Recommended adding 1-2 protein shakes daily and eat 4-6 small meals daily.  Plan to update CBC, iron panel, AFP.  He was due for RUQ ultrasound in August and routine cirrhosis labs in October.   Recommended hep A/B vaccination.  Labs completed in July with hemoglobin 14.5, normocytic indices, platelets 97, ferritin low at 13, saturation and iron low normal, AFP slightly elevated at 6.6.  I recommended a colonoscopy due to declining iron panel as well as weight loss.  Patient declined.  RUQ ultrasound August 2022 with cirrhosis without focal liver lesions. Recommended repeating AFP in 3 months.  Today:  Weight loss:  Cirrhosis:   MELD: 8 in April 2022.  Due for routine blood work. Ultrasound in February. EGD in 2024. Elevated AFP: Needs to be updated.  Low iron: Needs colonoscopy and to start iron.  Past Medical History:  Diagnosis Date   Back pain    Cirrhosis ([place]) 01/2020   No immunity to hepatitis A or B   Elevated cholesterol    Enlarged prostate    Hypertension     Past Surgical History:  Procedure Laterality Date   crush injuries Left    left hip and knee   ESOPHAGEAL BRUSHING  10/29/2020   Procedure: ESOPHAGEAL BRUSHING;  Surgeon: [name], DO;  Location: [facility];  Service: Endoscopy;;   ESOPHAGOGASTRODUODENOSCOPY (EGD) WITH PROPOFOL N/A 10/29/2020   Surgeon: [name], DO;moderately severe candidiasis esophagitis with no bleeding, gastritis, normal examined duodenum. Repeat in 2 years.   LUNG SURGERY Left    TONSILLECTOMY      Current Outpatient Medications  Medication Sig Dispense Refill   amLODipine (NORVASC) 10 MG tablet Take 10 mg by mouth daily.     Budeson-Glycopyrrol-Formoterol (  BREZTRI AEROSPHERE) 160-9-4.8 MCG/ACT AERO Inhale 2 puffs into the lungs 2 (two) times daily.     carbamazepine (TEGRETOL) 100 MG chewable tablet Chew 1 tablet (100 mg total) by mouth 3 (three) times daily. (Patient taking differently: Chew 100 mg by mouth 2 (two) times daily.) 90 tablet 0   doxylamine, Sleep, (UNISOM) 25 MG tablet Take 25 mg by mouth at bedtime as needed for sleep.     lisinopril (ZESTRIL) 20 MG tablet Take 1 tablet (20 mg total)  by mouth daily. 30 tablet 0   metoprolol tartrate (LOPRESSOR) 25 MG tablet Take 1 tablet (25 mg total) by mouth 2 (two) times daily. 60 tablet 0   oxyCODONE (ROXICODONE) 15 MG immediate release tablet Take 1 tablet by mouth every 4 (four) hours as needed for pain.     simvastatin (ZOCOR) 40 MG tablet Take 1 tablet (40 mg total) by mouth daily. 30 tablet 0   tamsulosin (FLOMAX) 0.4 MG CAPS capsule Take 1 capsule (0.4 mg total) by mouth daily. 30 capsule 0   No current facility-administered medications for this visit.    Allergies as of 05/28/2021 - Review Complete 01/22/2021  Allergen Reaction Noted   Neurontin [gabapentin] Other (See Comments) 08/24/2014    Family History  Problem Relation Age of Onset   Cancer Mother    CAD Father    Colon cancer Neg Hx    Liver disease Neg Hx     Social History   Socioeconomic History   Marital status: Divorced    Spouse name: Not on file   Number of children: Not on file   Years of education: Not on file   Highest education level: Not on file  Occupational History   Not on file  Tobacco Use   Smoking status: Every Day    Packs/day: 1.00    Years: 28.00    Pack years: 28.00    Types: Cigarettes   Smokeless tobacco: Never  Vaping Use   Vaping Use: Never used  Substance and Sexual Activity   Alcohol use: Not Currently    Comment: Quit April 2022   Drug use: Yes    Types: Marijuana   Sexual activity: Yes  Other Topics Concern   Not on file  Social History Narrative      Used to be a Geophysicist/field seismologist   Used to smoke until 2012   Used to drink heavily a long time ago   Prior drug use   Current wife is 2nd wife-married 2007   Finished high school-did some college   Social Determinants of Radio broadcast assistant Strain: Not on file  Food Insecurity: Not on file  Transportation Needs: Not on file  Physical Activity: Not on file  Stress: Not on file  Social Connections: Not on file    Review of Systems: Gen: Denies fever,  chills, anorexia. Denies fatigue, weakness, weight loss.  CV: Denies chest pain, palpitations, syncope, peripheral edema, and claudication. Resp: Denies dyspnea at rest, cough, wheezing, coughing up blood, and pleurisy. GI: Denies vomiting blood, jaundice, and fecal incontinence.   Denies dysphagia or odynophagia. Derm: Denies rash, itching, dry skin Psych: Denies depression, anxiety, memory loss, confusion. No homicidal or suicidal ideation.  Heme: Denies bruising, bleeding, and enlarged lymph nodes.  Physical Exam: There were no vitals taken for this visit. General:   Alert and oriented. No distress noted. Pleasant and cooperative.  Head:  Normocephalic and atraumatic. Eyes:  Conjuctiva clear without scleral icterus. Mouth:  Oral  mucosa pink and moist. Good dentition. No lesions. Heart:  S1, S2 present without murmurs appreciated. Lungs:  Clear to auscultation bilaterally. No wheezes, rales, or rhonchi. No distress.  Abdomen:  +BS, soft, non-tender and non-distended. No rebound or guarding. No HSM or masses noted. Msk:  Symmetrical without gross deformities. Normal posture. Extremities:  Without edema. Neurologic:  Alert and  oriented x4 Psych:  Alert and cooperative. Normal mood and affect.

## 2021-05-28 ENCOUNTER — Ambulatory Visit: Payer: Medicare Other | Admitting: Gastroenterology

## 2021-06-11 DIAGNOSIS — M159 Polyosteoarthritis, unspecified: Secondary | ICD-10-CM | POA: Diagnosis not present

## 2021-06-11 DIAGNOSIS — M1991 Primary osteoarthritis, unspecified site: Secondary | ICD-10-CM | POA: Diagnosis not present

## 2021-06-11 DIAGNOSIS — T7840XA Allergy, unspecified, initial encounter: Secondary | ICD-10-CM | POA: Diagnosis not present

## 2021-06-11 DIAGNOSIS — G894 Chronic pain syndrome: Secondary | ICD-10-CM | POA: Diagnosis not present

## 2021-06-12 DIAGNOSIS — Z1211 Encounter for screening for malignant neoplasm of colon: Secondary | ICD-10-CM | POA: Diagnosis not present

## 2021-07-10 ENCOUNTER — Encounter: Payer: Self-pay | Admitting: Internal Medicine

## 2021-07-29 ENCOUNTER — Encounter: Payer: Self-pay | Admitting: Internal Medicine

## 2021-09-16 ENCOUNTER — Telehealth: Payer: Self-pay | Admitting: Internal Medicine

## 2021-09-16 NOTE — Telephone Encounter (Signed)
Recall sent 

## 2021-09-16 NOTE — Telephone Encounter (Signed)
Recall for ultrasound 

## 2022-01-07 DIAGNOSIS — I700 Atherosclerosis of aorta: Secondary | ICD-10-CM | POA: Diagnosis not present

## 2022-01-07 DIAGNOSIS — G894 Chronic pain syndrome: Secondary | ICD-10-CM | POA: Diagnosis not present

## 2022-01-07 DIAGNOSIS — I10 Essential (primary) hypertension: Secondary | ICD-10-CM | POA: Diagnosis not present

## 2022-01-07 DIAGNOSIS — J449 Chronic obstructive pulmonary disease, unspecified: Secondary | ICD-10-CM | POA: Diagnosis not present

## 2022-01-07 DIAGNOSIS — M159 Polyosteoarthritis, unspecified: Secondary | ICD-10-CM | POA: Diagnosis not present

## 2022-01-07 DIAGNOSIS — E119 Type 2 diabetes mellitus without complications: Secondary | ICD-10-CM | POA: Diagnosis not present

## 2022-01-07 DIAGNOSIS — Z6824 Body mass index (BMI) 24.0-24.9, adult: Secondary | ICD-10-CM | POA: Diagnosis not present

## 2022-02-06 DIAGNOSIS — Z6824 Body mass index (BMI) 24.0-24.9, adult: Secondary | ICD-10-CM | POA: Diagnosis not present

## 2022-02-06 DIAGNOSIS — G894 Chronic pain syndrome: Secondary | ICD-10-CM | POA: Diagnosis not present

## 2022-02-06 DIAGNOSIS — M159 Polyosteoarthritis, unspecified: Secondary | ICD-10-CM | POA: Diagnosis not present

## 2022-02-06 DIAGNOSIS — J449 Chronic obstructive pulmonary disease, unspecified: Secondary | ICD-10-CM | POA: Diagnosis not present

## 2022-02-06 DIAGNOSIS — E119 Type 2 diabetes mellitus without complications: Secondary | ICD-10-CM | POA: Diagnosis not present

## 2022-02-06 DIAGNOSIS — I10 Essential (primary) hypertension: Secondary | ICD-10-CM | POA: Diagnosis not present

## 2022-03-10 DIAGNOSIS — M159 Polyosteoarthritis, unspecified: Secondary | ICD-10-CM | POA: Diagnosis not present

## 2022-03-10 DIAGNOSIS — K7460 Unspecified cirrhosis of liver: Secondary | ICD-10-CM | POA: Diagnosis not present

## 2022-03-10 DIAGNOSIS — J449 Chronic obstructive pulmonary disease, unspecified: Secondary | ICD-10-CM | POA: Diagnosis not present

## 2022-03-10 DIAGNOSIS — I10 Essential (primary) hypertension: Secondary | ICD-10-CM | POA: Diagnosis not present

## 2022-03-10 DIAGNOSIS — G894 Chronic pain syndrome: Secondary | ICD-10-CM | POA: Diagnosis not present

## 2022-03-10 DIAGNOSIS — I700 Atherosclerosis of aorta: Secondary | ICD-10-CM | POA: Diagnosis not present

## 2022-03-10 DIAGNOSIS — Z6824 Body mass index (BMI) 24.0-24.9, adult: Secondary | ICD-10-CM | POA: Diagnosis not present

## 2022-04-07 DIAGNOSIS — I10 Essential (primary) hypertension: Secondary | ICD-10-CM | POA: Diagnosis not present

## 2022-04-07 DIAGNOSIS — J449 Chronic obstructive pulmonary disease, unspecified: Secondary | ICD-10-CM | POA: Diagnosis not present

## 2022-04-07 DIAGNOSIS — I700 Atherosclerosis of aorta: Secondary | ICD-10-CM | POA: Diagnosis not present

## 2022-04-07 DIAGNOSIS — Z6823 Body mass index (BMI) 23.0-23.9, adult: Secondary | ICD-10-CM | POA: Diagnosis not present

## 2022-04-07 DIAGNOSIS — G894 Chronic pain syndrome: Secondary | ICD-10-CM | POA: Diagnosis not present

## 2022-04-07 DIAGNOSIS — M159 Polyosteoarthritis, unspecified: Secondary | ICD-10-CM | POA: Diagnosis not present

## 2022-04-15 IMAGING — US US ABDOMEN COMPLETE
1 series · 14 of 25 positions shown · non-contrast
Comparison: CT abdomen and pelvis January 20, 2020.

CLINICAL DATA: Cirrhosis follow-up 1 year.

EXAM:
ABDOMEN ULTRASOUND COMPLETE

[Series 1: us abdomen complete · 0.21mm/px · 14 of 104 slices shown]
[im 1/104]
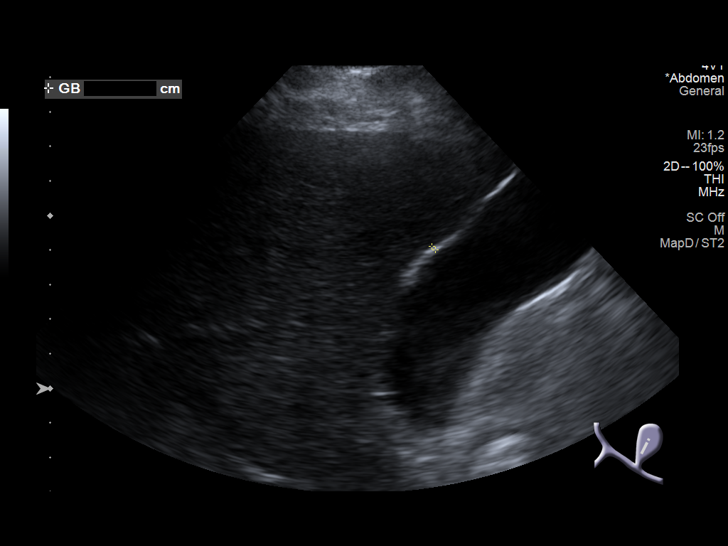
[im 9/104]
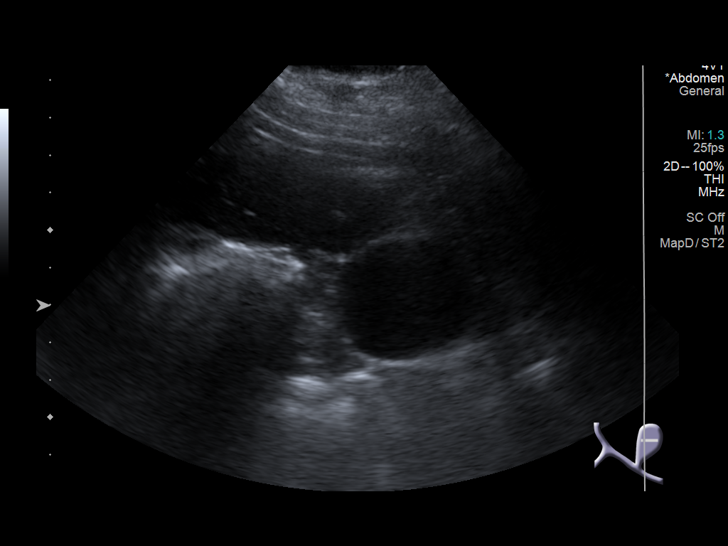
[im 18/104]
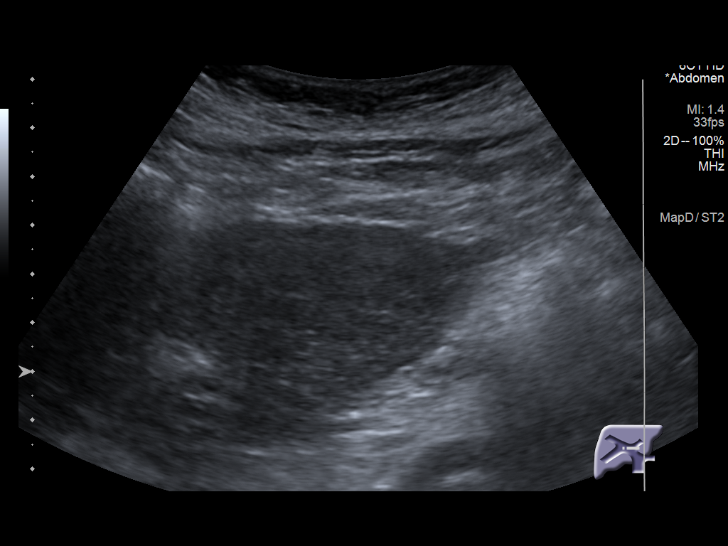
[im 26/104]
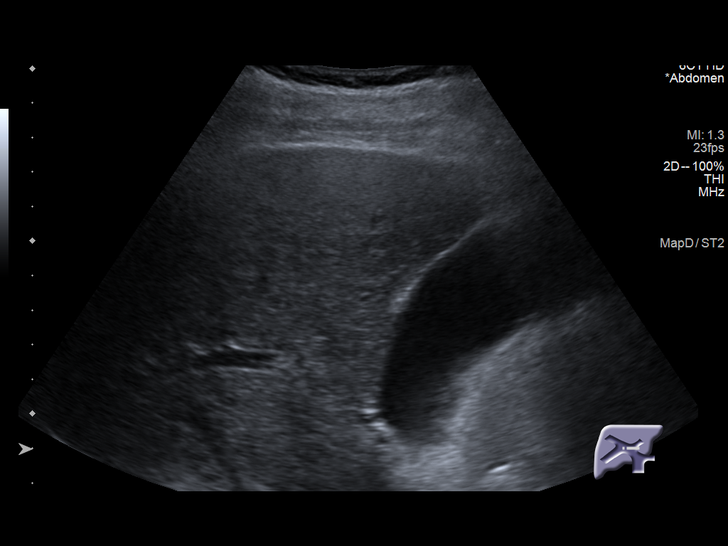
[im 35/104]
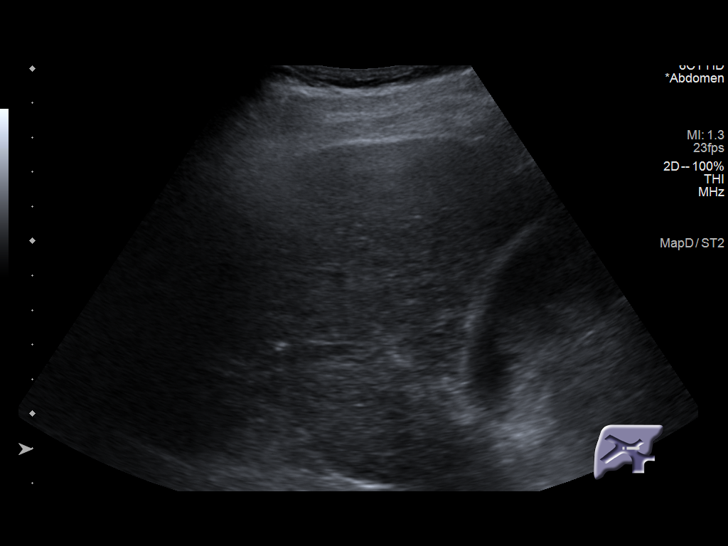
[im 39/104]
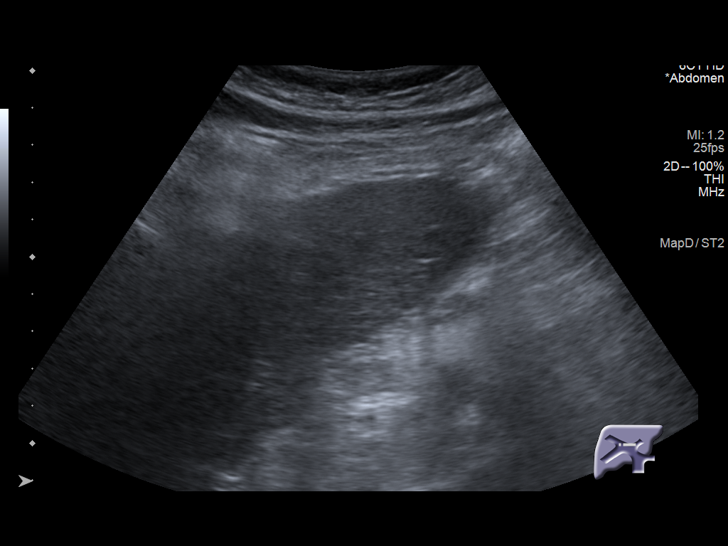
[im 48/104]
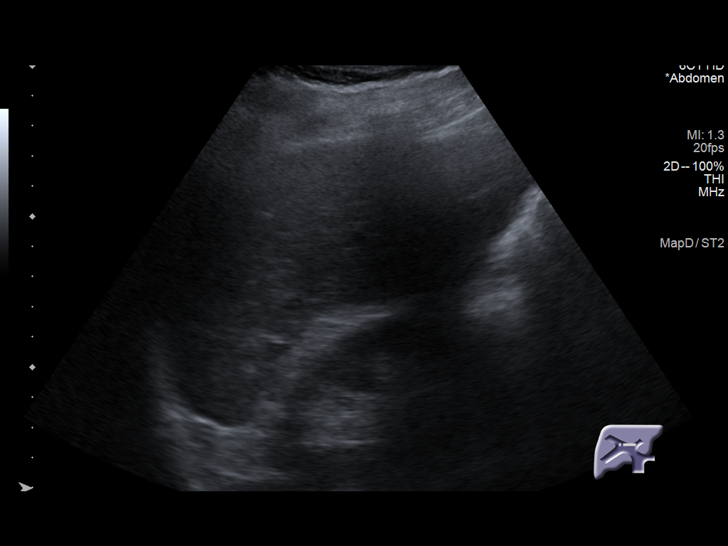
[im 56/104]
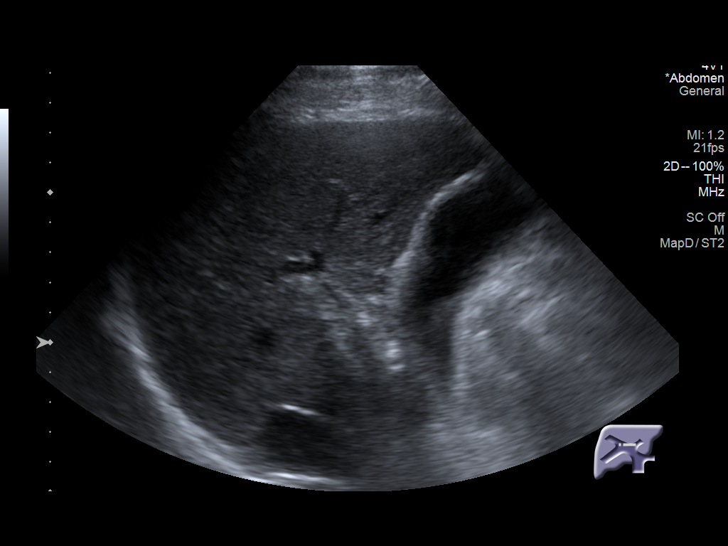
[im 65/104]
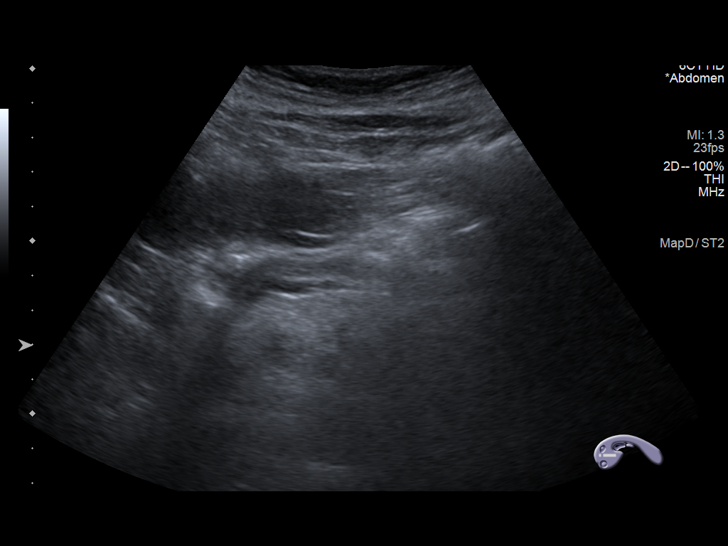
[im 69/104]
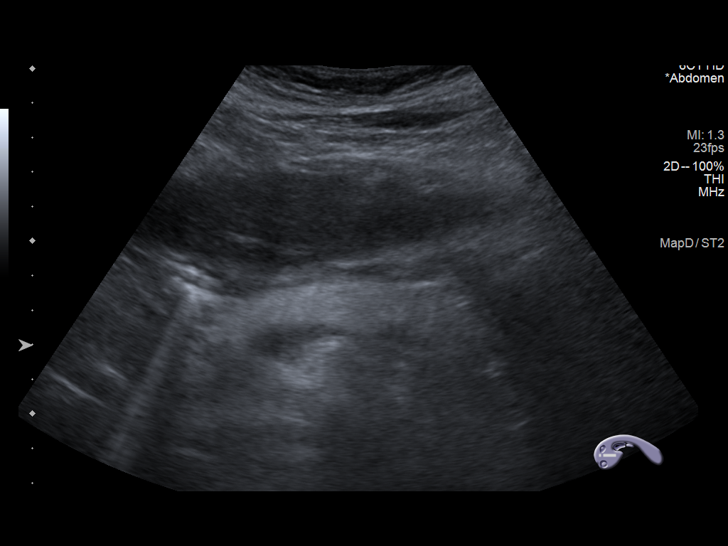
[im 78/104]
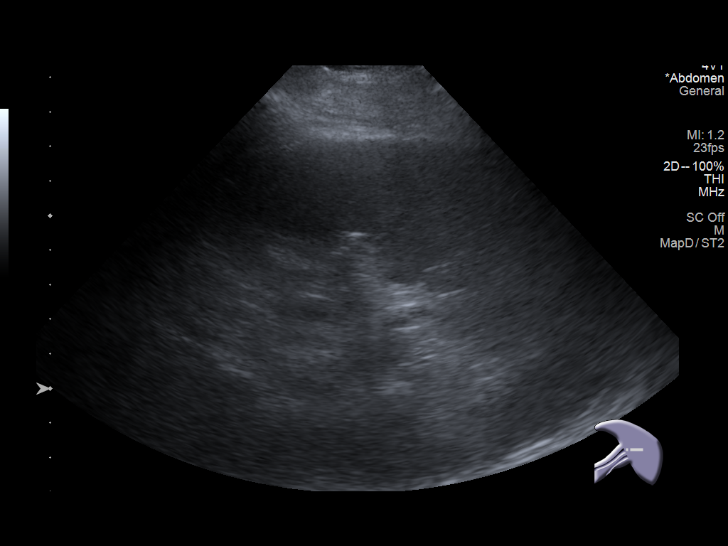
[im 86/104]
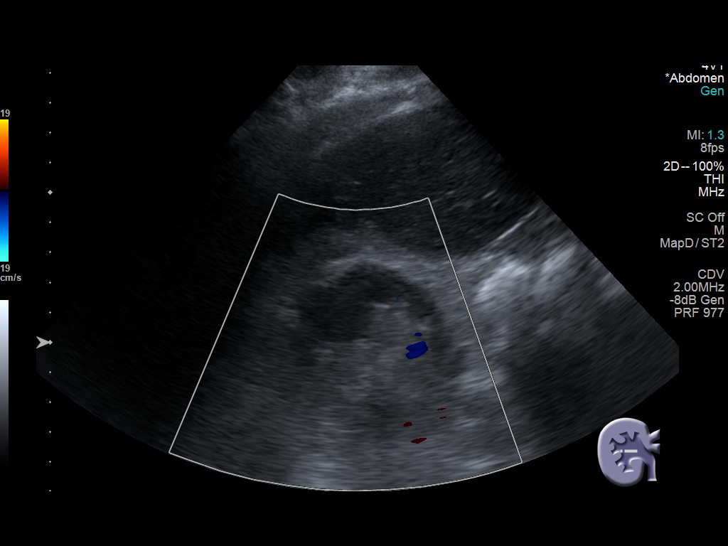
[im 95/104]
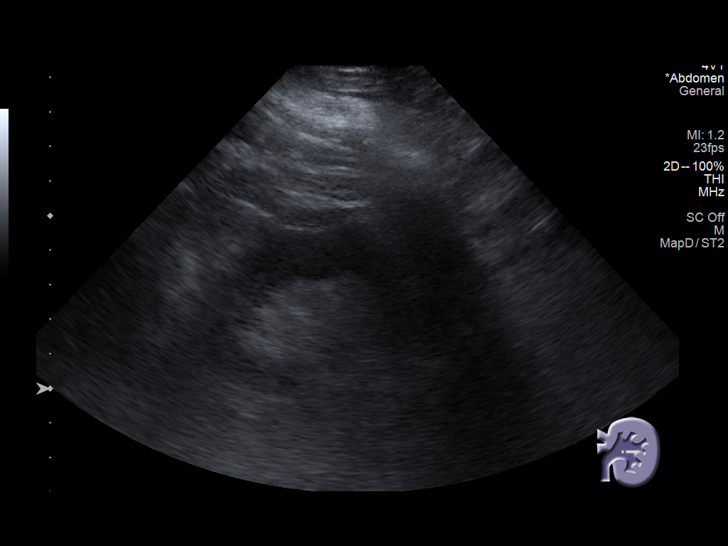
[im 104/104]
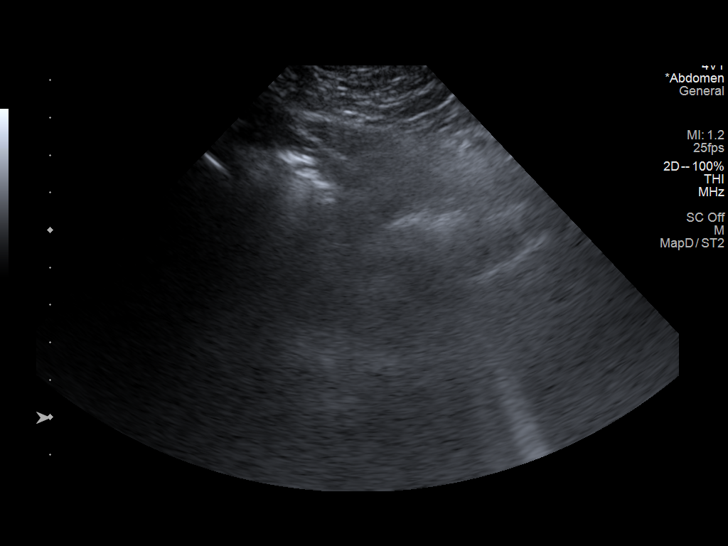

[14 of 25 positions shown; findings below may reference images not displayed]

FINDINGS: Gallbladder: No gallstones or wall thickening visualized. No
sonographic Murphy sign noted by sonographer.

Common bile duct: Diameter: 4 mm

Liver: No focal lesion identified. Diffusely increased echogenicity
with coarsened hepatic echotexture and contour nodularity. Portal
vein is patent on color Doppler imaging with normal direction of
blood flow towards the liver.

IVC: No abnormality visualized.

Pancreas: Visualized portion unremarkable.

Spleen: Size and appearance within normal limits.

Right Kidney: Length: 9.3 cm. Echogenicity within normal limits. No
mass or hydronephrosis visualized.

Left Kidney: Length: 9.6 cm. Echogenicity within normal limits. No
mass or hydronephrosis visualized.

Abdominal aorta: No aneurysm visualized.

Other findings: None.
IMPRESSION: Cirrhotic hepatic morphology without ascites. No focal liver lesion
identified.

## 2022-05-08 DIAGNOSIS — Z6824 Body mass index (BMI) 24.0-24.9, adult: Secondary | ICD-10-CM | POA: Diagnosis not present

## 2022-05-08 DIAGNOSIS — G894 Chronic pain syndrome: Secondary | ICD-10-CM | POA: Diagnosis not present

## 2022-05-08 DIAGNOSIS — E119 Type 2 diabetes mellitus without complications: Secondary | ICD-10-CM | POA: Diagnosis not present

## 2022-06-08 DIAGNOSIS — G894 Chronic pain syndrome: Secondary | ICD-10-CM | POA: Diagnosis not present

## 2022-06-08 DIAGNOSIS — Z6824 Body mass index (BMI) 24.0-24.9, adult: Secondary | ICD-10-CM | POA: Diagnosis not present

## 2022-07-09 DIAGNOSIS — I10 Essential (primary) hypertension: Secondary | ICD-10-CM | POA: Diagnosis not present

## 2022-07-09 DIAGNOSIS — N400 Enlarged prostate without lower urinary tract symptoms: Secondary | ICD-10-CM | POA: Diagnosis not present

## 2022-07-09 DIAGNOSIS — G894 Chronic pain syndrome: Secondary | ICD-10-CM | POA: Diagnosis not present

## 2022-07-09 DIAGNOSIS — K7460 Unspecified cirrhosis of liver: Secondary | ICD-10-CM | POA: Diagnosis not present

## 2022-07-09 DIAGNOSIS — J449 Chronic obstructive pulmonary disease, unspecified: Secondary | ICD-10-CM | POA: Diagnosis not present

## 2022-07-09 DIAGNOSIS — M159 Polyosteoarthritis, unspecified: Secondary | ICD-10-CM | POA: Diagnosis not present

## 2022-07-09 DIAGNOSIS — Z6824 Body mass index (BMI) 24.0-24.9, adult: Secondary | ICD-10-CM | POA: Diagnosis not present

## 2022-07-09 DIAGNOSIS — F259 Schizoaffective disorder, unspecified: Secondary | ICD-10-CM | POA: Diagnosis not present

## 2022-07-09 DIAGNOSIS — E119 Type 2 diabetes mellitus without complications: Secondary | ICD-10-CM | POA: Diagnosis not present

## 2022-07-09 DIAGNOSIS — F1120 Opioid dependence, uncomplicated: Secondary | ICD-10-CM | POA: Diagnosis not present

## 2022-08-10 DIAGNOSIS — E1159 Type 2 diabetes mellitus with other circulatory complications: Secondary | ICD-10-CM | POA: Diagnosis not present

## 2022-08-10 DIAGNOSIS — F1120 Opioid dependence, uncomplicated: Secondary | ICD-10-CM | POA: Diagnosis not present

## 2022-08-10 DIAGNOSIS — Z0001 Encounter for general adult medical examination with abnormal findings: Secondary | ICD-10-CM | POA: Diagnosis not present

## 2022-08-10 DIAGNOSIS — K7460 Unspecified cirrhosis of liver: Secondary | ICD-10-CM | POA: Diagnosis not present

## 2022-08-10 DIAGNOSIS — Z6824 Body mass index (BMI) 24.0-24.9, adult: Secondary | ICD-10-CM | POA: Diagnosis not present

## 2022-08-10 DIAGNOSIS — Z1331 Encounter for screening for depression: Secondary | ICD-10-CM | POA: Diagnosis not present

## 2022-08-10 DIAGNOSIS — I10 Essential (primary) hypertension: Secondary | ICD-10-CM | POA: Diagnosis not present

## 2022-08-10 DIAGNOSIS — G894 Chronic pain syndrome: Secondary | ICD-10-CM | POA: Diagnosis not present

## 2022-08-10 DIAGNOSIS — J449 Chronic obstructive pulmonary disease, unspecified: Secondary | ICD-10-CM | POA: Diagnosis not present

## 2022-08-31 ENCOUNTER — Ambulatory Visit (HOSPITAL_COMMUNITY)
Admission: RE | Admit: 2022-08-31 | Discharge: 2022-08-31 | Disposition: A | Discharge status: Home / Self Care | Payer: Medicare HMO | Source: Ambulatory Visit | Attending: Family Medicine | Admitting: Family Medicine

## 2022-08-31 ENCOUNTER — Other Ambulatory Visit (HOSPITAL_COMMUNITY): Payer: Self-pay | Admitting: Family Medicine

## 2022-08-31 DIAGNOSIS — Z6823 Body mass index (BMI) 23.0-23.9, adult: Secondary | ICD-10-CM | POA: Diagnosis not present

## 2022-08-31 DIAGNOSIS — J449 Chronic obstructive pulmonary disease, unspecified: Secondary | ICD-10-CM | POA: Diagnosis not present

## 2022-08-31 DIAGNOSIS — J439 Emphysema, unspecified: Secondary | ICD-10-CM | POA: Diagnosis not present

## 2022-08-31 DIAGNOSIS — R0981 Nasal congestion: Secondary | ICD-10-CM | POA: Diagnosis not present

## 2022-08-31 DIAGNOSIS — F259 Schizoaffective disorder, unspecified: Secondary | ICD-10-CM | POA: Diagnosis not present

## 2022-09-14 DIAGNOSIS — Z6824 Body mass index (BMI) 24.0-24.9, adult: Secondary | ICD-10-CM | POA: Diagnosis not present

## 2022-09-14 DIAGNOSIS — G894 Chronic pain syndrome: Secondary | ICD-10-CM | POA: Diagnosis not present

## 2022-09-15 ENCOUNTER — Encounter: Payer: Self-pay | Admitting: *Deleted

## 2022-10-16 DIAGNOSIS — F1120 Opioid dependence, uncomplicated: Secondary | ICD-10-CM | POA: Diagnosis not present

## 2022-10-16 DIAGNOSIS — Z6823 Body mass index (BMI) 23.0-23.9, adult: Secondary | ICD-10-CM | POA: Diagnosis not present

## 2022-10-16 DIAGNOSIS — G894 Chronic pain syndrome: Secondary | ICD-10-CM | POA: Diagnosis not present

## 2022-10-18 IMAGING — US US ABDOMEN LIMITED
1 series · 14 of 25 positions shown · non-contrast
Comparison: 08/29/2020

CLINICAL DATA: Follow-up cirrhosis

EXAM:
ULTRASOUND ABDOMEN LIMITED RIGHT UPPER QUADRANT

[Series 1: us abdomen limited ruq (liver/gb) · 14 of 47 slices shown]
[im 1/47]
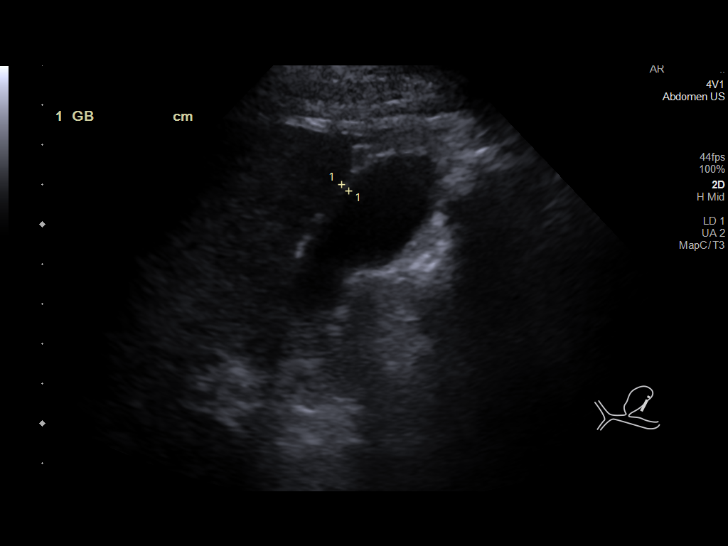
[im 4/47]
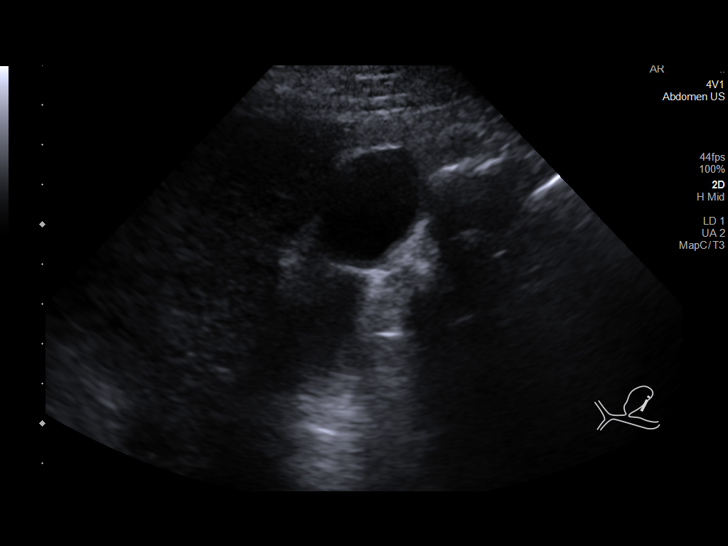
[im 8/47]
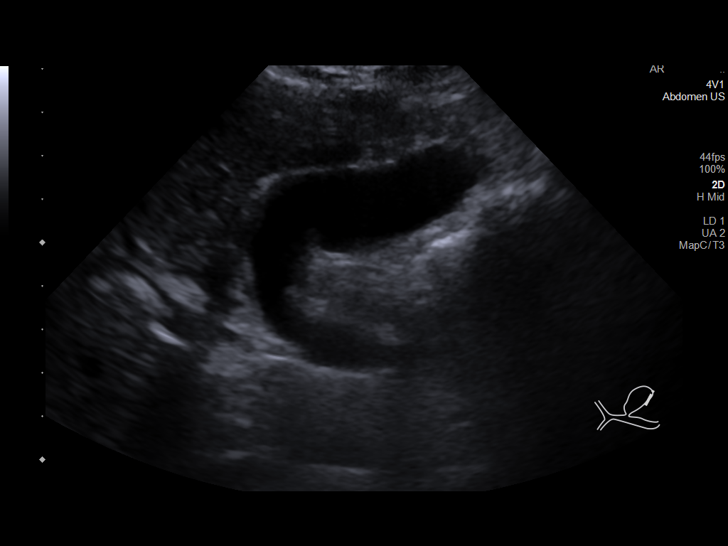
[im 12/47]
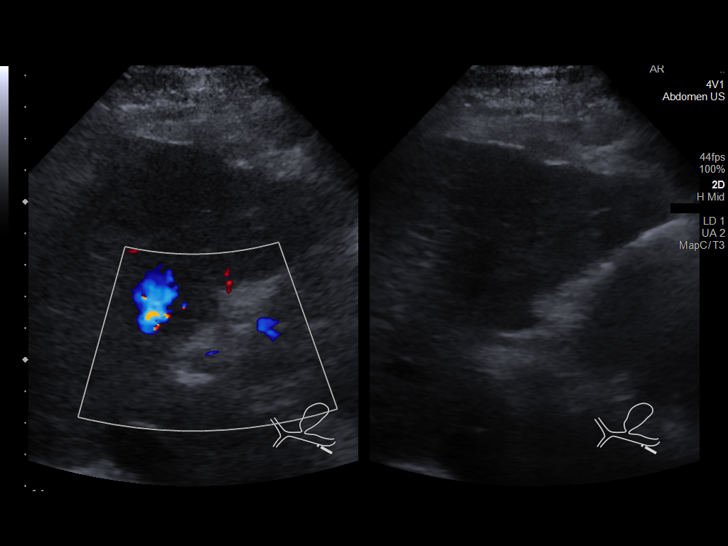
[im 16/47]
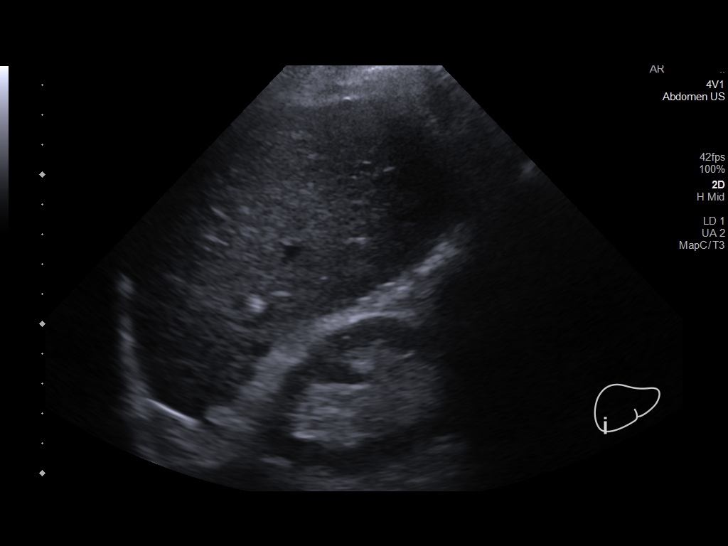
[im 18/47]
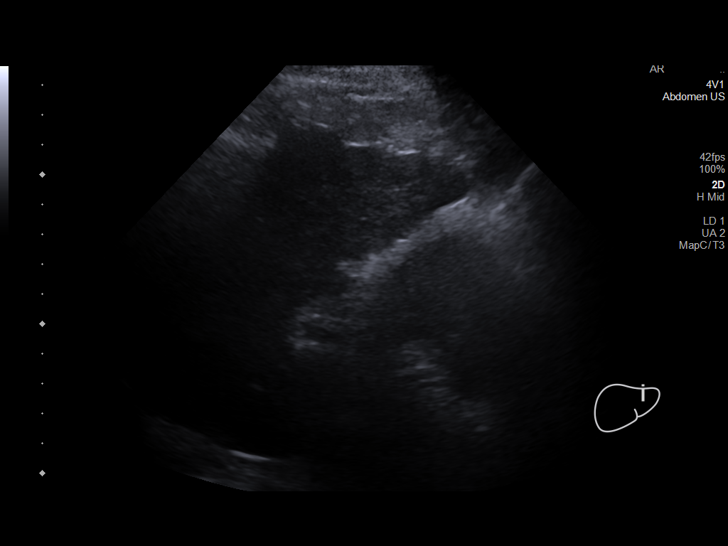
[im 22/47]
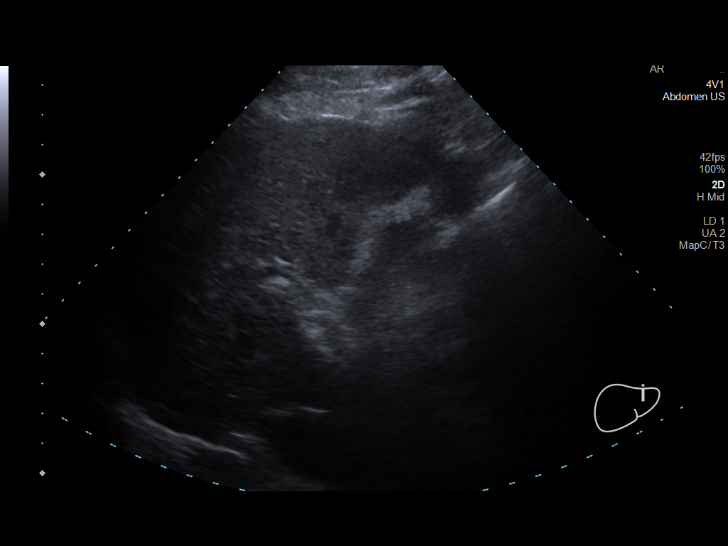
[im 25/47]
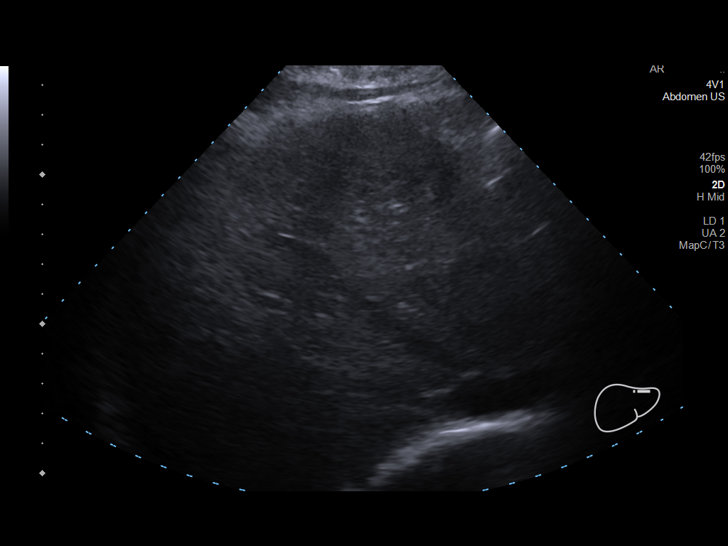
[im 29/47]
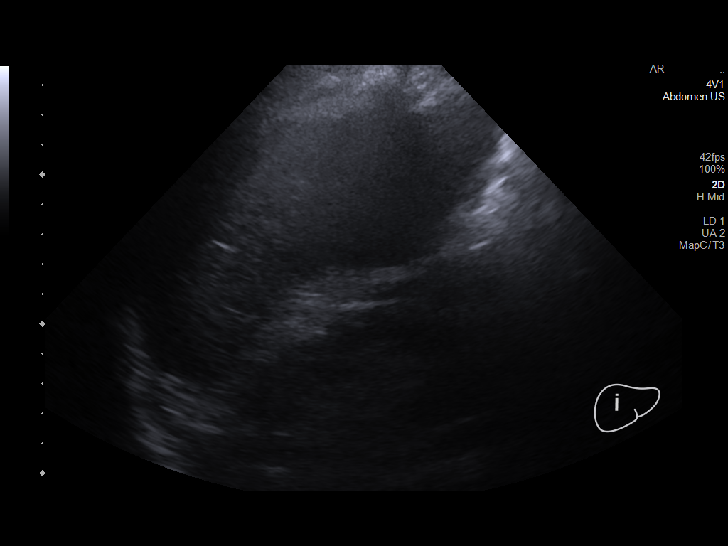
[im 31/47]
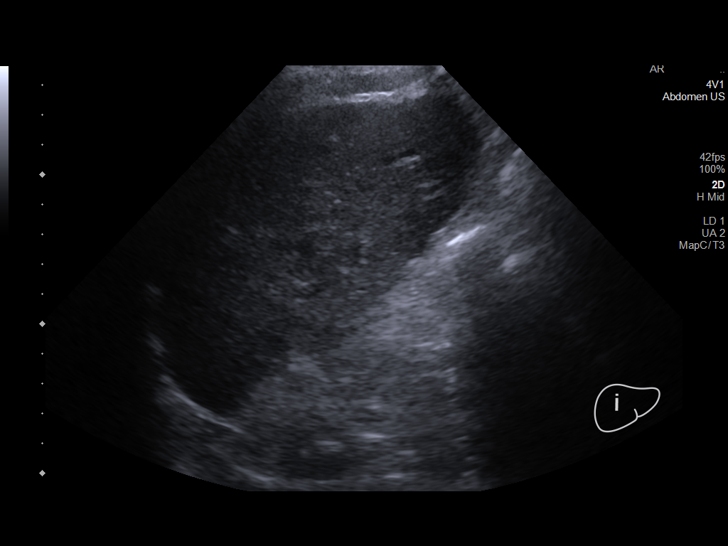
[im 35/47]
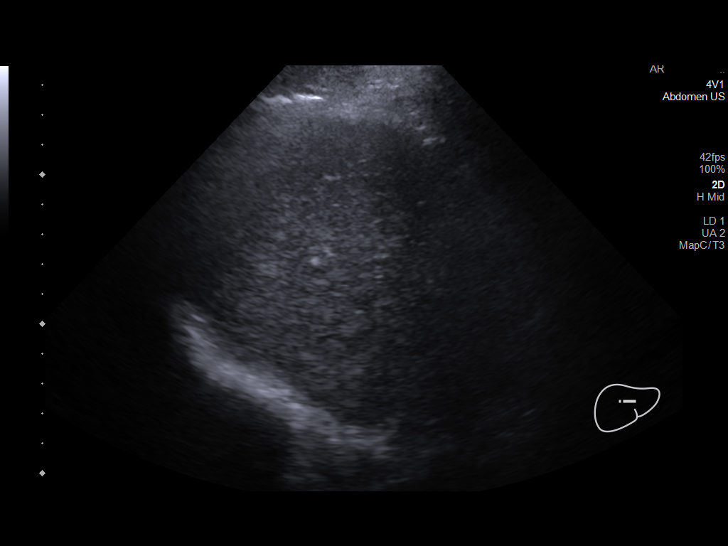
[im 39/47]
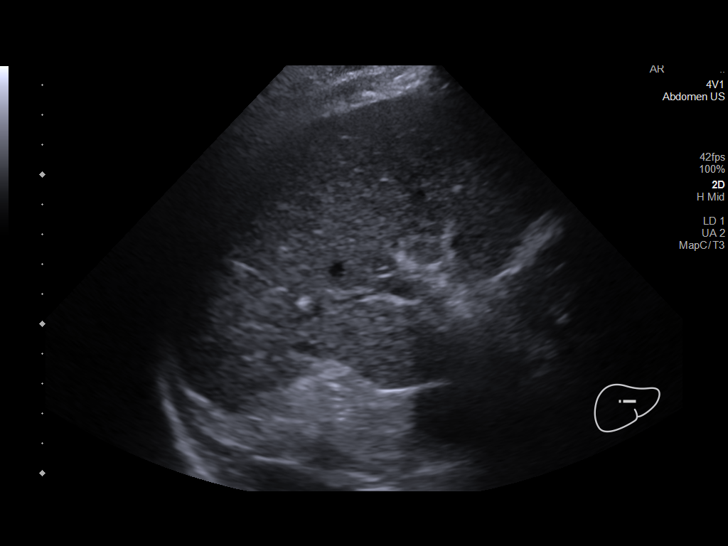
[im 43/47]
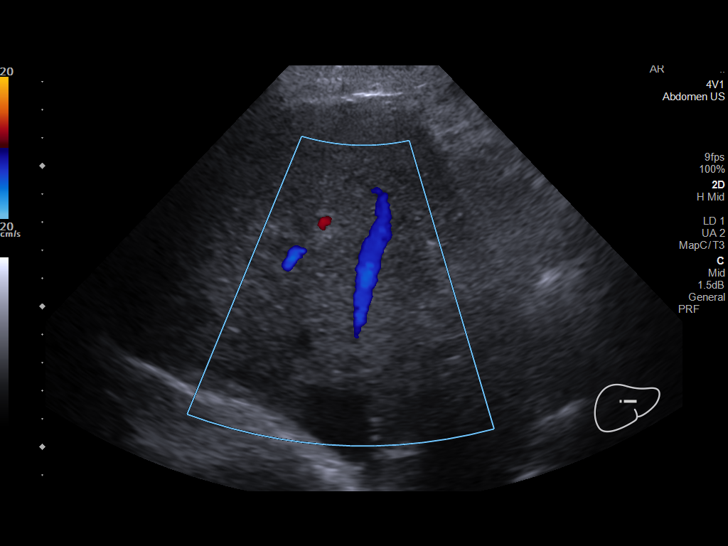
[im 47/47]
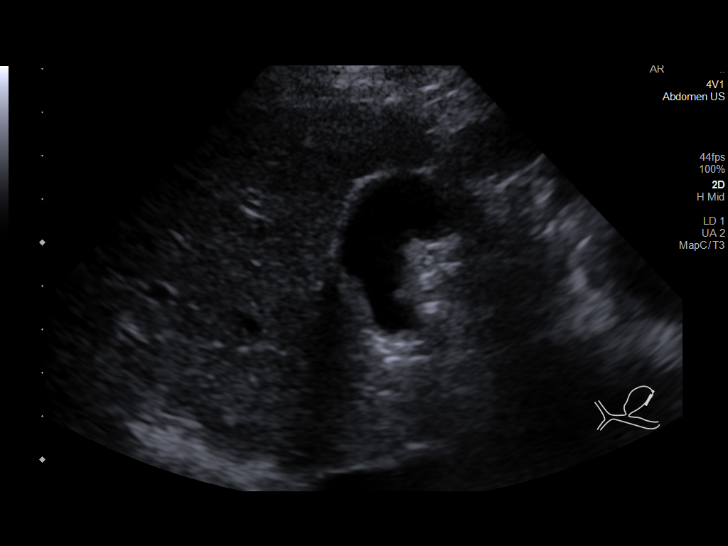

[14 of 25 positions shown; findings below may reference images not displayed]

FINDINGS: Gallbladder:

No gallstones or wall thickening visualized. No sonographic Murphy
sign noted by sonographer.

Common bile duct:

Diameter: 4 mm

Liver:

Nodularity is again identified without focal mass. These changes are
consistent with the given clinical history. Small calcification is
noted within the right lobe of the liver likely representing a
granuloma. Portal vein is patent on color Doppler imaging with
normal direction of blood flow towards the liver.

Other: None.
IMPRESSION: Cirrhotic change of the liver without acute mass lesion.

## 2022-11-13 DIAGNOSIS — Z6823 Body mass index (BMI) 23.0-23.9, adult: Secondary | ICD-10-CM | POA: Diagnosis not present

## 2022-11-13 DIAGNOSIS — F1120 Opioid dependence, uncomplicated: Secondary | ICD-10-CM | POA: Diagnosis not present

## 2022-11-13 DIAGNOSIS — G894 Chronic pain syndrome: Secondary | ICD-10-CM | POA: Diagnosis not present

## 2022-12-14 DIAGNOSIS — Z6823 Body mass index (BMI) 23.0-23.9, adult: Secondary | ICD-10-CM | POA: Diagnosis not present

## 2022-12-14 DIAGNOSIS — G894 Chronic pain syndrome: Secondary | ICD-10-CM | POA: Diagnosis not present

## 2023-01-18 DIAGNOSIS — F1120 Opioid dependence, uncomplicated: Secondary | ICD-10-CM | POA: Diagnosis not present

## 2023-01-18 DIAGNOSIS — Z6824 Body mass index (BMI) 24.0-24.9, adult: Secondary | ICD-10-CM | POA: Diagnosis not present

## 2023-01-18 DIAGNOSIS — R591 Generalized enlarged lymph nodes: Secondary | ICD-10-CM | POA: Diagnosis not present

## 2023-01-18 DIAGNOSIS — G894 Chronic pain syndrome: Secondary | ICD-10-CM | POA: Diagnosis not present

## 2023-02-15 DIAGNOSIS — Z6823 Body mass index (BMI) 23.0-23.9, adult: Secondary | ICD-10-CM | POA: Diagnosis not present

## 2023-02-15 DIAGNOSIS — G894 Chronic pain syndrome: Secondary | ICD-10-CM | POA: Diagnosis not present

## 2023-02-15 DIAGNOSIS — F1120 Opioid dependence, uncomplicated: Secondary | ICD-10-CM | POA: Diagnosis not present

## 2023-03-19 DIAGNOSIS — G894 Chronic pain syndrome: Secondary | ICD-10-CM | POA: Diagnosis not present

## 2023-03-19 DIAGNOSIS — Z6823 Body mass index (BMI) 23.0-23.9, adult: Secondary | ICD-10-CM | POA: Diagnosis not present

## 2023-04-22 DIAGNOSIS — Z6823 Body mass index (BMI) 23.0-23.9, adult: Secondary | ICD-10-CM | POA: Diagnosis not present

## 2023-04-22 DIAGNOSIS — G894 Chronic pain syndrome: Secondary | ICD-10-CM | POA: Diagnosis not present

## 2023-06-23 DIAGNOSIS — G894 Chronic pain syndrome: Secondary | ICD-10-CM | POA: Diagnosis not present

## 2023-07-23 DIAGNOSIS — E1159 Type 2 diabetes mellitus with other circulatory complications: Secondary | ICD-10-CM | POA: Diagnosis not present

## 2023-07-23 DIAGNOSIS — M159 Polyosteoarthritis, unspecified: Secondary | ICD-10-CM | POA: Diagnosis not present

## 2023-07-23 DIAGNOSIS — I10 Essential (primary) hypertension: Secondary | ICD-10-CM | POA: Diagnosis not present

## 2023-07-23 DIAGNOSIS — G894 Chronic pain syndrome: Secondary | ICD-10-CM | POA: Diagnosis not present

## 2023-07-23 DIAGNOSIS — J449 Chronic obstructive pulmonary disease, unspecified: Secondary | ICD-10-CM | POA: Diagnosis not present

## 2023-07-23 DIAGNOSIS — K7460 Unspecified cirrhosis of liver: Secondary | ICD-10-CM | POA: Diagnosis not present

## 2023-07-23 DIAGNOSIS — I700 Atherosclerosis of aorta: Secondary | ICD-10-CM | POA: Diagnosis not present

## 2023-07-23 DIAGNOSIS — Z6822 Body mass index (BMI) 22.0-22.9, adult: Secondary | ICD-10-CM | POA: Diagnosis not present

## 2023-08-16 DIAGNOSIS — R7309 Other abnormal glucose: Secondary | ICD-10-CM | POA: Diagnosis not present

## 2023-08-16 DIAGNOSIS — E785 Hyperlipidemia, unspecified: Secondary | ICD-10-CM | POA: Diagnosis not present

## 2023-08-16 DIAGNOSIS — Z6822 Body mass index (BMI) 22.0-22.9, adult: Secondary | ICD-10-CM | POA: Diagnosis not present

## 2023-08-16 DIAGNOSIS — Z0001 Encounter for general adult medical examination with abnormal findings: Secondary | ICD-10-CM | POA: Diagnosis not present

## 2023-08-16 DIAGNOSIS — I10 Essential (primary) hypertension: Secondary | ICD-10-CM | POA: Diagnosis not present

## 2023-08-16 DIAGNOSIS — G894 Chronic pain syndrome: Secondary | ICD-10-CM | POA: Diagnosis not present

## 2023-10-07 DIAGNOSIS — Z6821 Body mass index (BMI) 21.0-21.9, adult: Secondary | ICD-10-CM | POA: Diagnosis not present

## 2023-10-07 DIAGNOSIS — G894 Chronic pain syndrome: Secondary | ICD-10-CM | POA: Diagnosis not present

## 2023-11-05 DIAGNOSIS — G894 Chronic pain syndrome: Secondary | ICD-10-CM | POA: Diagnosis not present

## 2023-11-05 DIAGNOSIS — Z6822 Body mass index (BMI) 22.0-22.9, adult: Secondary | ICD-10-CM | POA: Diagnosis not present

## 2023-12-08 DIAGNOSIS — Z6820 Body mass index (BMI) 20.0-20.9, adult: Secondary | ICD-10-CM | POA: Diagnosis not present

## 2023-12-08 DIAGNOSIS — G894 Chronic pain syndrome: Secondary | ICD-10-CM | POA: Diagnosis not present

## 2024-01-06 DIAGNOSIS — G894 Chronic pain syndrome: Secondary | ICD-10-CM | POA: Diagnosis not present

## 2024-01-06 DIAGNOSIS — Z6820 Body mass index (BMI) 20.0-20.9, adult: Secondary | ICD-10-CM | POA: Diagnosis not present

## 2024-02-04 DIAGNOSIS — G894 Chronic pain syndrome: Secondary | ICD-10-CM | POA: Diagnosis not present

## 2024-02-04 DIAGNOSIS — Z6820 Body mass index (BMI) 20.0-20.9, adult: Secondary | ICD-10-CM | POA: Diagnosis not present

## 2024-03-07 DIAGNOSIS — J449 Chronic obstructive pulmonary disease, unspecified: Secondary | ICD-10-CM | POA: Diagnosis not present

## 2024-03-07 DIAGNOSIS — Z6820 Body mass index (BMI) 20.0-20.9, adult: Secondary | ICD-10-CM | POA: Diagnosis not present

## 2024-03-07 DIAGNOSIS — M159 Polyosteoarthritis, unspecified: Secondary | ICD-10-CM | POA: Diagnosis not present

## 2024-03-07 DIAGNOSIS — G894 Chronic pain syndrome: Secondary | ICD-10-CM | POA: Diagnosis not present

## 2024-03-07 DIAGNOSIS — K7460 Unspecified cirrhosis of liver: Secondary | ICD-10-CM | POA: Diagnosis not present

## 2024-03-07 DIAGNOSIS — I10 Essential (primary) hypertension: Secondary | ICD-10-CM | POA: Diagnosis not present
